# Patient Record
Sex: Female | Born: 1995 | Hispanic: Yes | Marital: Single | Smoking: Never smoker
Health system: Southern US, Community
[De-identification: ages and names within clinical notes are randomized; demographics above are authoritative.]

## PROBLEM LIST (undated history)

## (undated) ENCOUNTER — Inpatient Hospital Stay (HOSPITAL_COMMUNITY): Payer: Self-pay

## (undated) DIAGNOSIS — Z789 Other specified health status: Secondary | ICD-10-CM

## (undated) HISTORY — PX: APPENDECTOMY: SHX54

## (undated) HISTORY — DX: Other specified health status: Z78.9

---

## 2017-05-05 NOTE — L&D Delivery Note (Signed)
Patient: Amanda Bright MRN: [id number]  GBS status: Negative  Patient is a 22 y.o. now G1P1 s/p NSVD at [redacted]w[redacted]d, who was admitted for IOL for Pre-Eclampsia, on Magnesium during labor. AROM 1h 25m prior to delivery with bloody fluid.    Delivery Note At 6:47 AM a viable female was delivered via Vaginal, Spontaneous (Presentation: LOA).  APGAR: 8, 9; weight 7 lb 0.7 oz (3195 g).   Placenta status: spontaneous, intact.  Cord: 3 vessel with the following complications: none.    Anesthesia:  Epidural, 30 cc of Lidocaine for repair  Episiotomy: None Lacerations: 3rd degree Suture Repair: 3.0 vicryl, 3.0 monocryl  Est. Blood Loss (mL):  825    Head delivered LOA. No nuchal cord present. Shoulder and body delivered in usual fashion. Infant with spontaneous cry, placed on mother's abdomen, dried and bulb suctioned. Cord clamped x 2 after 1-minute delay, and cut by family member. Cord blood drawn. Placenta delivered spontaneously with gentle cord traction.  Perineum inspected and found to have 3A laceration, which was repaired with 3.0 Vicryl and 3.0 Monocryl with good hemostasis achieved. Delivery complicated by PPH with EBL of 825. Cytotec 1000 mg rectally, Methergine 0.2 mg IM, and TXA x2 given. Fundus firm with massage and Pitocin as well as additional medications mentioned.   Given severe Pre-eclampsia, will continue Magnesium Sulfate for 24h PP.   Mom to postpartum.  Baby to Couplet care / Skin to Skin.  De Hollingshead 01/12/2018, 8:39 AM

## 2017-07-22 ENCOUNTER — Inpatient Hospital Stay (HOSPITAL_COMMUNITY)
Admission: AD | Admit: 2017-07-22 | Discharge: 2017-07-22 | Disposition: A | Discharge status: Home / Self Care | Payer: Self-pay | Source: Ambulatory Visit | Attending: Obstetrics and Gynecology | Admitting: Obstetrics and Gynecology

## 2017-07-22 ENCOUNTER — Encounter (HOSPITAL_COMMUNITY): Payer: Self-pay

## 2017-07-22 ENCOUNTER — Other Ambulatory Visit: Payer: Self-pay

## 2017-07-22 DIAGNOSIS — Z9889 Other specified postprocedural states: Secondary | ICD-10-CM | POA: Insufficient documentation

## 2017-07-22 DIAGNOSIS — R109 Unspecified abdominal pain: Secondary | ICD-10-CM | POA: Insufficient documentation

## 2017-07-22 DIAGNOSIS — Z3A15 15 weeks gestation of pregnancy: Secondary | ICD-10-CM | POA: Insufficient documentation

## 2017-07-22 DIAGNOSIS — N949 Unspecified condition associated with female genital organs and menstrual cycle: Secondary | ICD-10-CM

## 2017-07-22 DIAGNOSIS — O26892 Other specified pregnancy related conditions, second trimester: Secondary | ICD-10-CM | POA: Insufficient documentation

## 2017-07-22 LAB — URINALYSIS, ROUTINE W REFLEX MICROSCOPIC
Bilirubin Urine: NEGATIVE
Glucose, UA: NEGATIVE mg/dL
Hgb urine dipstick: NEGATIVE
Ketones, ur: NEGATIVE mg/dL
Nitrite: NEGATIVE
PROTEIN: NEGATIVE mg/dL
SPECIFIC GRAVITY, URINE: 1.015 (ref 1.005–1.030)
pH: 7 (ref 5.0–8.0)

## 2017-07-22 NOTE — MAU Provider Note (Signed)
HPI: [name] is a 22yo G1P0 at ~15wks who presents with abdominal pain. She complains of sharp bilateral lower abdominal pain that is worse with movement and lying down for the past two weeks. She has had morning sickness throughout this pregnancy but no increased n/v. She had negative STI screening a few weeks ago and denies any new partners or concern for STI. Her dating as not been confirmed by US at this time, and she had irregular periods due to prior depo. She denies vaginal bleeding, LOF, urinary symptoms, fever, chills.   PMH:  Appendectomy ~2004  PE:  General: well appearing in no distress CV: RRR, no MRG Pulm: CTAB Abdominal: mild lower abdominal tenderness > in the suprapubic region. Bimanual: uterus consistent with [redacted]wk gestation. No CMT  Urinalysis    Component Value Date/Time   COLORURINE YELLOW 07/22/2017 0950   APPEARANCEUR CLEAR 07/22/2017 0950   LABSPEC 1.015 07/22/2017 0950   PHURINE 7.0 07/22/2017 0950   GLUCOSEU NEGATIVE 07/22/2017 0950   HGBUR NEGATIVE 07/22/2017 0950   BILIRUBINUR NEGATIVE 07/22/2017 0950   KETONESUR NEGATIVE 07/22/2017 0950   PROTEINUR NEGATIVE 07/22/2017 0950   NITRITE NEGATIVE 07/22/2017 0950   LEUKOCYTESUR MODERATE (A) 07/22/2017 0950    A/P [name] is a 22 yo G1P0 at ~15 wks who presents with sharp lower abdominal pain consistent with round ligament pain.   -Plan to defer STI testing at this time given recent negative results -[facility] -Reassured patient about normalcy of round ligament pain and outpatient treatment measures.   Lawanna KobusS. Amanda Bright Medical Student 07/22/17 10:59

## 2017-07-22 NOTE — MAU Provider Note (Signed)
History     CSN: [id number]  Arrival date and time: 07/22/17 [id number]   First Provider Initiated Contact with Patient 07/22/17 1011      Chief Complaint  Patient presents with  . Abdominal Pain   HPI [name] is a 22 y.o. G1P0 at 5153w5d who presents to MAU today with complaint of 2 weeks of sharp 10/10 pain in her lower abdomen bilaterally. The pain comes and goes and is worse with ambulation and changes of positions. She denies vaginal bleeding, abnormal discharge, UTI symptoms or diarrhea/constipation. She has had some nausea and occasional vomiting that is unchanged from earlier in the pregnancy. She has not taken any pain medication.   OB History    Gravida Para Term Preterm AB Living   1             SAB TAB Ectopic Multiple Live Births                  History reviewed. No pertinent past medical history.  Past Surgical History:  Procedure Laterality Date  . APPENDECTOMY     3rd grade    Family History  Problem Relation Age of Onset  . Alcohol abuse Neg Hx   . Arthritis Neg Hx   . Asthma Neg Hx   . Birth defects Neg Hx   . Cancer Neg Hx   . COPD Neg Hx   . Depression Neg Hx   . Diabetes Neg Hx   . Drug abuse Neg Hx   . Early death Neg Hx   . Hearing loss Neg Hx   . Heart disease Neg Hx   . Hyperlipidemia Neg Hx   . Hypertension Neg Hx   . Kidney disease Neg Hx   . Learning disabilities Neg Hx   . Mental illness Neg Hx   . Mental retardation Neg Hx   . Miscarriages / Stillbirths Neg Hx   . Stroke Neg Hx   . Vision loss Neg Hx   . Varicose Veins Neg Hx     Social History   Tobacco Use  . Smoking status: Never Smoker  . Smokeless tobacco: Never Used  Substance Use Topics  . Alcohol use: Yes    Comment: social  . Drug use: No    Allergies: No Known Allergies  No medications prior to admission.    Review of Systems  Constitutional: Negative for fever.  Gastrointestinal: Positive for abdominal pain, nausea and vomiting. Negative for  constipation and diarrhea.  Genitourinary: Negative for dysuria, frequency, urgency, vaginal bleeding and vaginal discharge.   Physical Exam   Blood pressure 132/74, pulse 87, temperature 98.2 F (36.8 C), temperature source Oral, resp. rate 18, height 5\' 4"  (1.626 m), weight 130 lb (59 kg), SpO2 100 %.  Physical Exam  Nursing note and vitals reviewed. Constitutional: She is oriented to person, place, and time. She appears well-developed and well-nourished. No distress.  HENT:  Head: Normocephalic and atraumatic.  Cardiovascular: Normal rate.  Respiratory: Effort normal.  GI: Soft. Bowel sounds are normal. She exhibits no distension and no mass. There is tenderness (very mild lower abdominal tenderness to palpation of the lower abdomen bilaterally). There is no rebound and no guarding.  Neurological: She is alert and oriented to person, place, and time.  Skin: Skin is warm and dry. No erythema.  Psychiatric: She has a normal mood and affect.  Dilation: Closed Effacement (%): Thick Cervical Position: Posterior Exam by:: Vonzella NippleJulie Chenelle Benning, PA-C   No results  found for this or any previous visit (from the past 24 hour(s)).  MAU Course  Procedures None  MDM FHR - 148 bpm with doppler UA today  STD testing deferred as patient states negative testing < 3 weeks ago and no new partners or symptoms.  Assessment and Plan  A:  SIUP at [redacted]w[redacted]d (approximately)  Round ligament pain   P:  Discharge home Tylenol PRN for pain Abdominal binder, moderation of activity and warm bath/shower advised for pain management  Second trimester precautions discussed Patient advised to follow-up with [facility] to start prenatal care Contact information given for [employer] to help with financial coverage since patient does not qualify for Medicaid as a non-citizen Patient may return to MAU as needed or if her condition were to change or worsen   Vonzella Nipple, PA-C 07/22/2017, 10:55 AM

## 2017-07-22 NOTE — Discharge Instructions (Signed)

## 2017-07-22 NOTE — MAU Note (Addendum)
G1 @ [redacted] wksga according to LMP at [facility]ath department. [facility] car trouble. Here due to abdominal pain that started two weeks ago. Denies bleeding.   Provider at bs assessing.   1053: d/c orders received.  D/C instructions given with pt understanding. Pt left unit via ambulatory with her sister.

## 2017-07-29 ENCOUNTER — Encounter: Payer: Self-pay | Admitting: Medical

## 2017-09-07 ENCOUNTER — Ambulatory Visit (INDEPENDENT_AMBULATORY_CARE_PROVIDER_SITE_OTHER): Payer: Self-pay | Admitting: Advanced Practice Midwife

## 2017-09-07 ENCOUNTER — Encounter: Payer: Self-pay | Admitting: Advanced Practice Midwife

## 2017-09-07 DIAGNOSIS — Z3402 Encounter for supervision of normal first pregnancy, second trimester: Secondary | ICD-10-CM

## 2017-09-07 DIAGNOSIS — Z3400 Encounter for supervision of normal first pregnancy, unspecified trimester: Secondary | ICD-10-CM

## 2017-09-07 LAB — POCT URINALYSIS DIP (DEVICE)
Bilirubin Urine: NEGATIVE
GLUCOSE, UA: NEGATIVE mg/dL
Hgb urine dipstick: NEGATIVE
Ketones, ur: NEGATIVE mg/dL
NITRITE: NEGATIVE
Protein, ur: NEGATIVE mg/dL
Specific Gravity, Urine: 1.025 (ref 1.005–1.030)
UROBILINOGEN UA: 1 mg/dL (ref 0.0–1.0)
pH: 7 (ref 5.0–8.0)

## 2017-09-07 NOTE — Progress Notes (Addendum)
Subjective:   Amanda Bright is a 22 y.o. G1P0000 at [redacted]w[redacted]d by LMP being seen today for her first obstetrical visit.  Her obstetrical history is significant for none. Patient does intend to breast feed. Pregnancy history fully reviewed.  Patient was on depo provera. She had a +UPT at [facility] clinic in [place] on 05/20/17, and states that an [facility] was calculated at that time based on physical exam. No [place] was done.   Patient reports no complaints.  HISTORY: OB History  Gravida Para Term Preterm AB Living  1 0 0 0 0 0  SAB TAB Ectopic Multiple Live Births  0 0 0 0 0    # Outcome Date GA Lbr Len/2nd Weight Sex Delivery Anes PTL Lv  1 Current             Last pap smear was done 07/2017 and was normal per patient, will get records   Past Medical History:  Diagnosis Date  . Medical history non-contributory    Past Surgical History:  Procedure Laterality Date  . APPENDECTOMY     3rd grade   Family History  Problem Relation Age of Onset  . Alcohol abuse Neg Hx   . Arthritis Neg Hx   . Asthma Neg Hx   . Birth defects Neg Hx   . Cancer Neg Hx   . COPD Neg Hx   . Depression Neg Hx   . Diabetes Neg Hx   . Drug abuse Neg Hx   . Early death Neg Hx   . Hearing loss Neg Hx   . Heart disease Neg Hx   . Hyperlipidemia Neg Hx   . Hypertension Neg Hx   . Kidney disease Neg Hx   . Learning disabilities Neg Hx   . Mental illness Neg Hx   . Mental retardation Neg Hx   . Miscarriages / Stillbirths Neg Hx   . Stroke Neg Hx   . Vision loss Neg Hx   . Varicose Veins Neg Hx    Social History   Tobacco Use  . Smoking status: Never Smoker  . Smokeless tobacco: Never Used  Substance Use Topics  . Alcohol use: Yes    Comment: social  . Drug use: No   No Known Allergies Current Outpatient Medications on File Prior to Visit  Medication Sig Dispense Refill  . prenatal vitamin w/FE, FA (PRENATAL 1 + 1) 27-1 MG TABS tablet Take 1 tablet by mouth daily at 12 noon.     No current  facility-administered medications on file prior to visit.     Review of Systems Pertinent items noted in HPI and remainder of comprehensive ROS otherwise negative.  Exam   Vitals:   09/07/17 0847  BP: 106/68  Pulse: 71  Weight: 132 lb 12.8 oz (60.2 kg)   Fetal Heart Rate (bpm): 145  Uterus:   21cm  Pelvic Exam: Perineum:    Vulva:    Vagina:     Cervix:    Adnexa:    Bony Pelvis:   System: General: well-developed, well-nourished female in no acute distress   Breast:  normal appearance, no masses or tenderness   Skin: normal coloration and turgor, no rashes   Neurologic: oriented, normal, negative, normal mood   Extremities: normal strength, tone, and muscle mass, ROM of all joints is normal   HEENT PERRLA, extraocular movement intact and sclera clear, anicteric   Mouth/Teeth mucous membranes moist, pharynx normal without lesions and dental hygiene good   Neck supple  and no masses   Cardiovascular: regular rate and rhythm   Respiratory:  no respiratory distress, normal breath sounds   Abdomen: soft, non-tender; bowel sounds normal; no masses,  no organomegaly    Assessment:   Pregnancy: G1P0000 Patient Active Problem List   Diagnosis Date Noted  . Supervision of normal first pregnancy, antepartum 09/07/2017     Plan:  1. Supervision of normal first pregnancy, antepartum  - [facility] AMB BABYSCRIPTS OPT IN - Culture, OB Urine - Cystic fibrosis gene test - GC/Chlamydia probe amp ([place])not at [facility] - Hemoglobinopathy Evaluation - Obstetric Panel, Including HIV - SMN1 COPY NUMBER ANALYSIS (SMA Carrier Screen) - [place] MFM OB COMP + 14 WK; Future - Need to get ROI for pap records at [place]    Initial labs drawn. Continue prenatal vitamins. Genetic Screening discussed, First trimester screen, Quad screen and NIPS: too late for some, and declines others . Ultrasound discussed; fetal anatomic survey: requested. Problem list reviewed and updated. The nature of [place]  [facility] with multiple MDs and other Advanced Practice Providers was explained to patient; also emphasized that residents, students are part of our team. Routine obstetric precautions reviewed. Return in about 1 month (around 10/05/2017).

## 2017-09-07 NOTE — Patient Instructions (Signed)
AREA PEDIATRIC/FAMILY PRACTICE PHYSICIANS  [facility] [address], [address] [place], [place]  [address] Phone - [phone]   Fax - [phone]  [facility] [address] [place], [place] [address] Phone - [phone]   Fax - [phone]  [facility] AM[facility] [address] [place], [place]  [address] Phone - [phone]   Fax - [phone]  [facility] [address] [place], [place]  [address] Phone - [phone]   Fax - [phone]  [facility] [address] [place], [place]  [address] Phone - [phone]   Fax - [phone]  [facility] [address] [place], [place]  [address] Phone - [phone]   Fax - [phone]  [facility] [address] [place], [place]  [address] Phone - [phone]   Fax - [phone]  [facility] [address] [place], [place]  [address] Phone - [phone]   Fax - [phone]  [facility] [address] [place], [place]  [address] Phone - [phone]   Fax - [phone] [facility] [address] [place], [place]  [address] Phone - [phone]   Fax - [phone]  [facility] [address] [place], [place]  [address] Phone - [phone]   Fax - [phone]  [facility] [address] [place], [place]  [address] Phone - [phone]   Fax - [phone]  [facility] [address] [place], [place]  [address] Phone - [phone]   Fax - [phone]  SHILPA GOSRANI [address] [place], [place]  [address] Phone - [phone]  [facility] [address] [place], [place]  [address] Phone - [phone]   Fax - [phone]  [facility] [address] [place], [place]  [address] Phone - [phone]   Fax - [phone]  [facility] [address] [place], [place]  [address] Phone - [phone]   Fax - [phone]  [facility] [address] [place], [place]  [address] Phone - [phone]   Fax - [phone]   [facility] [address] [place], [place]  [address] Phone - [phone]   Fax - [phone]  [facility] [address] [place], [place]  [address] Phone - [phone]   Fax - [phone]  DAVID RUBIN [address] [place], [place]  [address] Phone - [phone]   Fax - [phone]  [facility] [address] , [place]  [address] Phone - [phone]   Fax - [phone]  [facility] [address] , [place]  [address] Phone - [phone]   Fax - [phone] [facility] [address] [place], [place]  [address] Phone - [phone]   Fax - [phone]  [facility] [address] [place], [place]  [address] Phone - [phone]   Fax - [phone]  [facility] [address] [place], [place]  [address] Phone - [phone]   Fax - [phone]  [facility] Charlene Flemming MD [address] [place] [address] Phone [phone]  Fax [phone]  Childbirth Education Options: [facility] Classes:  Childbirth education classes can help you   get ready for a positive parenting experience. You can also meet other expectant parents and get free stuff for your baby. Each class runs for five weeks on the same night and costs $45 for the mother-to-be and her support person. Medicaid covers the cost if you are eligible. Call [phone] to register. [facility] Childbirth Education:  [phone] or 5174845873 or  [email or web address]  Baby & Me Class: Discuss newborn & infant parenting and family adjustment issues with other new mothers in a relaxed environment. Each week brings a new speaker or baby-centered activity. We encourage new mothers to join [place] every Thursday at 11:00am. Babies birth until crawling. No registration or fee. Daddy [employer]: This course offers Dads-to-be the tools and knowledge needed to feel confident on their journey to becoming new fathers. Experienced dads, who have been trained as coaches, teach dads-to-be how to hold, comfort, diaper, swaddle and play with their infant while being able to support the new mom as well. A class for men taught by men. $25/dad Big Brother/Big Sister: Let your children share in the joy of a new brother or sister in this special class designed just for them. Class includes discussion about how families care for babies: swaddling, holding, diapering, safety as well as how they can be helpful in their new role. This class is designed for children ages 26 to 31, but any age is welcome. Please register each child individually. $5/child  Mom Talk: This mom-led group offers support and connection to mothers as they journey through the adjustments and struggles of that sometimes overwhelming first year after the birth of a child. Tuesdays at 10:00am and Thursdays at 6:00pm. Babies welcome. No registration or fee. Breastfeeding Support Group: This group is a mother-to-mother support circle where moms have the opportunity to share their breastfeeding experiences. A Lactation Consultant is present for questions and concerns. Meets each Tuesday at 11:00am. No fee or registration. Breastfeeding Your Baby: Learn what to expect in the first days of breastfeeding your newborn.  This class will help you feel more confident with the skills needed to begin your breastfeeding experience. Many new mothers are concerned about breastfeeding after leaving the hospital. This class  will also address the most common fears and challenges about breastfeeding during the first few weeks, months and beyond. (call for fee) Comfort Techniques and Tour: This 2 hour interactive class will provide you the opportunity to learn & practice hands-on techniques that can help relieve some of the discomfort of labor and encourage your baby to rotate toward the best position for birth. You and your partner will be able to try a variety of labor positions with birth balls and rebozos as well as practice breathing, relaxation, and visualization techniques. A tour of the [facility] is included with this class. $20 per registrant and support person Childbirth Class- Weekend Option: This class is a Weekend version of our Birth & Baby series. It is designed for parents who have a difficult time fitting several weeks of classes into their schedule. It covers the care of your newborn and the basics of labor and childbirth. It also includes a [place] [facility] and lunch. The class is held two consecutive days: beginning on Friday evening from 6:30 - 8:30 p.m. and the next day, Saturday from 9 a.m. - 4 p.m. (call for fee) Doren Custard Class: Interested in a waterbirth?  This informational class will help you discover whether waterbirth is the right fit for you.  Education about waterbirth itself, supplies you would need and how to assemble your support team is what you can expect from this class. Some obstetrical practices require this class in order to pursue a waterbirth. (Not all obstetrical practices offer waterbirth-check with your healthcare provider.) Register only the expectant mom, but you are encouraged to bring your partner to class! Required if planning waterbirth, no fee. Infant/Child CPR: Parents, grandparents, babysitters, and friends learn Cardio-Pulmonary Resuscitation skills for infants and children. You will also learn how to treat both conscious  and unconscious choking in infants and children. This Family & Friends program does not offer certification. Register each participant individually to ensure that enough mannequins are available. (Call for fee) Grandparent Love: Expecting a grandbaby? This class is for you! Learn about the latest infant care and safety recommendations and ways to support your own child as he or she transitions into the parenting role. Taught by Registered Nurses who are childbirth instructors, but most importantly...they are grandmothers too! $10/person. Childbirth Class- Natural Childbirth: This series of 5 weekly classes is for expectant parents who want to learn and practice natural methods of coping with the process of labor and childbirth. Relaxation, breathing, massage, visualization, role of the partner, and helpful positioning are highlighted. Participants learn how to be confident in their body's ability to give birth. This class will empower and help parents make informed decisions about their own care. Includes discussion that will help new parents transition into the immediate postpartum period. [facility] is included. We suggest taking this class between 25-32 weeks, but it's only a recommendation. $75 per registrant and one support person or $30 Medicaid. Childbirth Class- 3 week Series: This option of 3 weekly classes helps you and your labor partner prepare for childbirth. Newborn care, labor & birth, cesarean birth, pain management, and comfort techniques are discussed and a [place] [facility] is included. The class meets at the same time, on the same day of the week for 3 consecutive weeks beginning with the starting date you choose. $60 for registrant and one support person.  Marvelous Multiples: Expecting twins, triplets, or more? This class covers the differences in labor, birth, parenting, and breastfeeding issues that face multiples' parents.  NICU tour is included. Led by a Certified Childbirth Educator who is the mother of twins. No fee. Caring for Baby: This class is for expectant and adoptive parents who want to learn and practice the most up-to-date newborn care for their babies. Focus is on birth through the first six weeks of life. Topics include feeding, bathing, diapering, crying, umbilical cord care, circumcision care and safe sleep. Parents learn to recognize symptoms of illness and when to call the pediatrician. Register only the mom-to-be and your partner or support person can plan to come with you! $10 per registrant and support person Childbirth Class- online option: This online class offers you the freedom to complete a Birth and Baby series in the comfort of your own home. The flexibility of this option allows you to review sections at your own pace, at times convenient to you and your support people. It includes additional video information, animations, quizzes, and extended activities. Get organized with helpful eClass tools, checklists, and trackers. Once you register online for the class, you will receive an email within a few days to accept the invitation and begin the class when the time is right for you. The content will be available to you for 60 days. $  60 for 60 days of online access for you and your support people.  Local Doulas: Natural Baby Doulas [email or web address] Tel: [phone] [email or web address] [facility] [phone] [email or web address] The Labor Ladies  (also do waterbirth tub rental) [phone] [email or web address] [facility] [phone] [email or web address] Sacred Rhythms  [phone] [email or web address] [place] [facility] [employer] (PADA) [email or web address] http://www.padanc.org/index.htm [place] [facility]   [email or web address] Considering Waterbirth? Guide for patients at [facility]  Why consider waterbirth?  . Gentle birth for babies . Less pain medicine used in labor . May allow for passive descent/less pushing . May reduce perineal tears  . More mobility and instinctive maternal position changes . Increased maternal relaxation . Reduced blood pressure in labor  Is waterbirth safe? What are the risks of infection, drowning or other complications?  . Infection: o Very low risk (3.7 % for tub vs 4.8% for bed) o 7 in 8000 waterbirths with documented infection o Poorly cleaned equipment most common cause o Slightly lower group B strep transmission rate  . Drowning o Maternal:  - Very low risk   - Related to seizures or fainting o Newborn:  - Very low risk. No evidence of increased risk of respiratory problems in multiple large studies - Physiological protection from breathing under water - Avoid underwater birth if there are any fetal complications - Once baby's head is out of the water, keep it out.  . Birth complication o Some reports of cord trauma, but risk decreased by bringing baby to surface gradually o No evidence of increased risk of shoulder dystocia. Mothers can usually change positions faster in water than in a bed, possibly aiding the maneuvers to free the shoulder.   You must attend a Waterbirth class at [facility]  3rd Wednesday of every month from 7-9pm  Free  Register by calling [phone] or online at [email or web address]  Bring us the certificate from the class to your prenatal appointment  Meet with a midwife at 36 weeks to see if you can still plan a waterbirth and to sign the consent.   Purchase or rent the following supplies:   Water Birth Pool (Birth Pool in a Box or [place] for instance)  (Tubs start ~$125)  Single-use disposable tub liner designed for your brand of tub  New garden hose labeled  "lead-free", "suitable for drinking water",  Electric drain pump to remove water (We recommend 792 gallon per hour or greater pump.)   Separate garden hose to remove the dirty water  Fish net  Bathing suit top (optional)  Long-handled mirror (optional)  Places to purchase or rent supplies  Yourwaterbirth.com for tub purchases and supplies  Waterbirthsolutions.com for tub purchases and supplies  The Labor Ladies (www.[email or web address]) $275 for tub rental/set-up & take down/kit   [place] [facility] (http://www.padanc.org/MeetUs.htm) Information regarding doulas (labor support) who provide pool rentals  Our practice has a Birth Pool in a Box tub at the hospital that you may borrow on a first-come-first-served basis. It is your responsibility to to set up, clean and break down the tub. We cannot guarantee the availability of this tub in advance. You are responsible for bringing all accessories listed above. If you do not have all necessary supplies you cannot have a waterbirth.    Things that would prevent you from having a waterbirth:  Premature, <37wks  Previous cesarean birth  Presence of thick meconium-stained fluid  Multiple gestation (Twins, triplets, etc.)  Uncontrolled diabetes or gestational diabetes requiring medication  Hypertension requiring medication   or diagnosis of pre-eclampsia  Heavy vaginal bleeding  Non-reassuring fetal heart rate  Active infection (MRSA, etc.). Group B Strep is NOT a contraindication for  waterbirth.  If your labor has to be induced and induction method requires continuous  monitoring of the baby's heart rate  Other risks/issues identified by your obstetrical provider  Please remember that birth is unpredictable. Under certain unforeseeable circumstances your provider may advise against giving birth in the tub. These decisions will be made on a case-by-case basis and with the safety of you and your baby as our highest  priority.     Places to have your son circumcised:    [facility] [phone] $480 while you are in hospital  [facility] [phone] $244 by 4 wks  [facility] [phone] $[phone] by 2 wks  [facility] [phone] $250 by 7 days [facility] [phone] $269 by 4 wks  These prices sometimes change but are roughly what you can expect to pay. Please call and confirm pricing.   Circumcision is considered an elective/non-medically necessary procedure. There are many reasons parents decide to have their sons circumsized. During the first year of life circumcised males have a reduced risk of urinary tract infections but after this year the rates between circumcised males and uncircumcised males are the same.  It is safe to have your son circumcised outside of the hospital and the places above perform them regularly.   Deciding about Circumcision in Baby Boys  (Up-to-date The Basics)  What is circumcision?  Circumcision is a surgery that removes the skin that covers the tip of the penis, called the "foreskin" Circumcision is usually done when a boy is between 1 and 10 days old. In the [place], circumcision is common. In some other countries, fewer boys are circumcised. Circumcision is a common tradition in some religions.  Should I have my baby boy circumcised?  There is no easy answer. Circumcision has some benefits. But it also has risks. After talking with your doctor, you will have to decide for yourself what is right for your family.  What are the benefits of circumcision?  Circumcised boys seem to have slightly lower rates of: ?Urinary tract infections ?Swelling of the opening at the tip of the penis Circumcised men seem to have slightly lower rates of: ?Urinary tract infections ?Swelling of the opening at the tip of the  penis ?Penis cancer ?HIV and other infections that you catch during sex ?Cervical cancer in the women they have sex with Even so, in the [place], the risks of these problems are small - even in boys and men who have not been circumcised. Plus, boys and men who are not circumcised can reduce these extra risks by: ?Cleaning their penis well ?Using condoms during sex  What are the risks of circumcision?  Risks include: ?Bleeding or infection from the surgery ?Damage to or amputation of the penis ?A chance that the doctor will cut off too much or not enough of the foreskin ?A chance that sex won't feel as good later in life Only about 1 out of every 200 circumcisions leads to problems. There is also a chance that your health insurance won't pay for circumcision.  How is circumcision done in baby boys?  First, the baby gets medicine for pain relief. This might be a cream on the skin or a shot into the base of the penis. Next, the doctor cleans the baby's penis well. Then he or she uses special tools to cut off the foreskin.   Finally, the doctor wraps a bandage (called gauze) around the baby's penis. If you have your baby circumcised, his doctor or nurse will give you instructions on how to care for him after the surgery. It is important that you follow those instructions carefully.  Safe Medications in Pregnancy   Acne: Benzoyl Peroxide Salicylic Acid  Backache/Headache: Tylenol: 2 regular strength every 4 hours OR              2 Extra strength every 6 hours  Colds/Coughs/Allergies: Benadryl (alcohol free) 25 mg every 6 hours as needed Breath right strips Claritin Cepacol throat lozenges Chloraseptic throat spray Cold-Eeze- up to three times per day Cough drops, alcohol free Flonase (by prescription only) Guaifenesin Mucinex Robitussin DM (plain only, alcohol free) Saline nasal spray/drops Sudafed (pseudoephedrine) & Actifed ** use only after [redacted] weeks gestation and if you  do not have high blood pressure Tylenol Vicks Vaporub Zinc lozenges Zyrtec   Constipation: Colace Ducolax suppositories Fleet enema Glycerin suppositories Metamucil Milk of magnesia Miralax Senokot Smooth move tea  Diarrhea: Kaopectate Imodium A-D  *NO pepto Bismol  Hemorrhoids: Anusol Anusol HC Preparation H Tucks  Indigestion: Tums Maalox Mylanta Zantac  Pepcid  Insomnia: Benadryl (alcohol free) 25mg every 6 hours as needed Tylenol PM Unisom, no Gelcaps  Leg Cramps: Tums MagGel  Nausea/Vomiting:  Bonine Dramamine Emetrol Ginger extract Sea bands Meclizine  Nausea medication to take during pregnancy:  Unisom (doxylamine succinate 25 mg tablets) Take one tablet daily at bedtime. If symptoms are not adequately controlled, the dose can be increased to a maximum recommended dose of two tablets daily (1/2 tablet in the morning, 1/2 tablet mid-afternoon and one at bedtime). Vitamin B6 100mg tablets. Take one tablet twice a day (up to 200 mg per day).  Skin Rashes: Aveeno products Benadryl cream or 25mg every 6 hours as needed Calamine Lotion 1% cortisone cream  Yeast infection: Gyne-lotrimin 7 Monistat 7   **If taking multiple medications, please check labels to avoid duplicating the same active ingredients **take medication as directed on the label ** Do not exceed 4000 mg of tylenol in 24 hours **Do not take medications that contain aspirin or ibuprofen     

## 2017-09-08 ENCOUNTER — Other Ambulatory Visit: Payer: Self-pay | Admitting: Advanced Practice Midwife

## 2017-09-08 ENCOUNTER — Ambulatory Visit (HOSPITAL_COMMUNITY)
Admission: RE | Admit: 2017-09-08 | Discharge: 2017-09-08 | Disposition: A | Discharge status: Home / Self Care | Payer: Self-pay | Source: Ambulatory Visit | Attending: Advanced Practice Midwife | Admitting: Advanced Practice Midwife

## 2017-09-08 DIAGNOSIS — Z3400 Encounter for supervision of normal first pregnancy, unspecified trimester: Secondary | ICD-10-CM

## 2017-09-08 DIAGNOSIS — Z369 Encounter for antenatal screening, unspecified: Secondary | ICD-10-CM

## 2017-09-08 DIAGNOSIS — O321XX0 Maternal care for breech presentation, not applicable or unspecified: Secondary | ICD-10-CM | POA: Insufficient documentation

## 2017-09-08 DIAGNOSIS — O0932 Supervision of pregnancy with insufficient antenatal care, second trimester: Secondary | ICD-10-CM

## 2017-09-08 DIAGNOSIS — Z3A21 21 weeks gestation of pregnancy: Secondary | ICD-10-CM

## 2017-09-08 DIAGNOSIS — Z3689 Encounter for other specified antenatal screening: Secondary | ICD-10-CM | POA: Insufficient documentation

## 2017-09-10 LAB — CULTURE, OB URINE

## 2017-09-10 LAB — URINE CULTURE, OB REFLEX

## 2017-09-14 ENCOUNTER — Encounter: Payer: Self-pay | Admitting: Advanced Practice Midwife

## 2017-09-14 LAB — HEMOGLOBIN A1C
Est. average glucose Bld gHb Est-mCnc: 103 mg/dL
Hgb A1c MFr Bld: 5.2 % (ref 4.8–5.6)

## 2017-09-14 LAB — SMN1 COPY NUMBER ANALYSIS (SMA CARRIER SCREENING)

## 2017-09-14 LAB — OBSTETRIC PANEL, INCLUDING HIV
Antibody Screen: NEGATIVE
BASOS ABS: 0 10*3/uL (ref 0.0–0.2)
Basos: 0 %
EOS (ABSOLUTE): 0.1 10*3/uL (ref 0.0–0.4)
Eos: 1 %
HEP B S AG: NEGATIVE
HIV Screen 4th Generation wRfx: NONREACTIVE
Hematocrit: 31.5 % — ABNORMAL LOW (ref 34.0–46.6)
Hemoglobin: 10.6 g/dL — ABNORMAL LOW (ref 11.1–15.9)
IMMATURE GRANULOCYTES: 1 %
Immature Grans (Abs): 0 10*3/uL (ref 0.0–0.1)
LYMPHS ABS: 2.3 10*3/uL (ref 0.7–3.1)
LYMPHS: 31 %
MCH: 29.4 pg (ref 26.6–33.0)
MCHC: 33.7 g/dL (ref 31.5–35.7)
MCV: 87 fL (ref 79–97)
Monocytes Absolute: 0.6 10*3/uL (ref 0.1–0.9)
Monocytes: 8 %
NEUTROS PCT: 59 %
Neutrophils Absolute: 4.4 10*3/uL (ref 1.4–7.0)
PLATELETS: 312 10*3/uL (ref 150–379)
RBC: 3.61 x10E6/uL — AB (ref 3.77–5.28)
RDW: 14 % (ref 12.3–15.4)
RPR: NONREACTIVE
Rh Factor: POSITIVE
Rubella Antibodies, IGG: 1.72 index (ref >0.99)
WBC: 7.4 10*3/uL (ref 3.4–10.8)

## 2017-09-14 LAB — HEMOGLOBINOPATHY EVALUATION
FERRITIN: 10 ng/mL — AB (ref 15–150)
HGB A2 QUANT: 2.3 % (ref 1.8–3.2)
HGB A: 97.7 % (ref 96.4–98.8)
HGB VARIANT: 0 %
Hgb C: 0 %
Hgb F Quant: 0 % (ref 0.0–2.0)
Hgb S: 0 %
Hgb Solubility: NEGATIVE

## 2017-09-14 LAB — SPECIMEN STATUS REPORT

## 2017-09-14 LAB — CYSTIC FIBROSIS GENE TEST

## 2017-10-02 ENCOUNTER — Other Ambulatory Visit: Payer: Self-pay | Admitting: General Practice

## 2017-10-02 DIAGNOSIS — Z3400 Encounter for supervision of normal first pregnancy, unspecified trimester: Secondary | ICD-10-CM

## 2017-10-05 ENCOUNTER — Telehealth: Payer: Self-pay | Admitting: Advanced Practice Midwife

## 2017-10-05 ENCOUNTER — Encounter: Payer: Self-pay | Admitting: Advanced Practice Midwife

## 2017-10-05 ENCOUNTER — Ambulatory Visit (INDEPENDENT_AMBULATORY_CARE_PROVIDER_SITE_OTHER): Payer: Self-pay | Admitting: Advanced Practice Midwife

## 2017-10-05 ENCOUNTER — Other Ambulatory Visit (INDEPENDENT_AMBULATORY_CARE_PROVIDER_SITE_OTHER): Payer: Self-pay

## 2017-10-05 VITALS — BP 123/73 | HR 70 | Wt 138.1 lb

## 2017-10-05 DIAGNOSIS — N898 Other specified noninflammatory disorders of vagina: Secondary | ICD-10-CM

## 2017-10-05 DIAGNOSIS — Z3400 Encounter for supervision of normal first pregnancy, unspecified trimester: Secondary | ICD-10-CM

## 2017-10-05 DIAGNOSIS — Z113 Encounter for screening for infections with a predominantly sexual mode of transmission: Secondary | ICD-10-CM

## 2017-10-05 DIAGNOSIS — O26892 Other specified pregnancy related conditions, second trimester: Secondary | ICD-10-CM

## 2017-10-05 NOTE — Telephone Encounter (Signed)
[facility]

## 2017-10-05 NOTE — Progress Notes (Signed)
   PRENATAL VISIT NOTE  Subjective:  Amanda Bright is a 22 y.o. G1P0000 at 347w3d being seen today for ongoing prenatal care.  She is currently monitored for the following issues for this low-risk pregnancy and has Supervision of normal first pregnancy, antepartum on their problem list.  Patient reports no complaints.  Contractions: Not present. Vag. Bleeding: None.  Movement: Present. Denies leaking of fluid.   Patient c/o vaginal discharge with odor x 1 week.   The following portions of the patient's history were reviewed and updated as appropriate: allergies, current medications, past family history, past medical history, past social history, past surgical history and problem list. Problem list updated.  Objective:   Vitals:   10/05/17 0911  BP: 123/73  Pulse: 70  Weight: 138 lb 1.6 oz (62.6 kg)    Fetal Status: Fetal Heart Rate (bpm): 138 Fundal Height: 25 cm Movement: Present     General:  Alert, oriented and cooperative. Patient is in no acute distress.  Skin: Skin is warm and dry. No rash noted.   Cardiovascular: Normal heart rate noted  Respiratory: Normal respiratory effort, no problems with respiration noted  Abdomen: Soft, gravid, appropriate for gestational age.  Pain/Pressure: Present     Pelvic: Cervical exam deferred        Extremities: Normal range of motion.  Edema: None  Mental Status: Normal mood and affect. Normal behavior. Normal judgment and thought content.   Assessment and Plan:  Pregnancy: G1P0000 at [redacted]w[redacted]d  1. Supervision of normal first pregnancy, antepartum - US MFM OB FOLLOW UP; Future (scheduled for 10/12/17) - 2 hour GTT and labs today  - ROI for pap completed today   Preterm labor symptoms and general obstetric precautions including but not limited to vaginal bleeding, contractions, leaking of fluid and fetal movement were reviewed in detail with the patient. Please refer to After Visit Summary for other counseling recommendations.  Return in  about 1 month (around 11/02/2017).  Future Appointments  Date Time Provider Department Center  10/12/2017 12:45 PM [facility] US 2 [facility]    Thressa ShellerHeather Hogan, CNM

## 2017-10-05 NOTE — Progress Notes (Signed)
C/o vaginal itching and fishy smell. ROI for pap completed.

## 2017-10-06 LAB — CBC
HEMATOCRIT: 30.5 % — AB (ref 34.0–46.6)
Hemoglobin: 10 g/dL — ABNORMAL LOW (ref 11.1–15.9)
MCH: 28.3 pg (ref 26.6–33.0)
MCHC: 32.8 g/dL (ref 31.5–35.7)
MCV: 86 fL (ref 79–97)
PLATELETS: 311 10*3/uL (ref 150–450)
RBC: 3.53 x10E6/uL — AB (ref 3.77–5.28)
RDW: 13.5 % (ref 12.3–15.4)
WBC: 8.1 10*3/uL (ref 3.4–10.8)

## 2017-10-06 LAB — CERVICOVAGINAL ANCILLARY ONLY
Bacterial vaginitis: POSITIVE — AB
Candida vaginitis: NEGATIVE
Chlamydia: NEGATIVE
Neisseria Gonorrhea: NEGATIVE
Trichomonas: NEGATIVE

## 2017-10-06 LAB — GLUCOSE TOLERANCE, 2 HOURS W/ 1HR
Glucose, 1 hour: 127 mg/dL (ref 65–179)
Glucose, 2 hour: 115 mg/dL (ref 65–152)
Glucose, Fasting: 85 mg/dL (ref 65–91)

## 2017-10-06 LAB — RPR: RPR Ser Ql: NONREACTIVE

## 2017-10-06 LAB — HIV ANTIBODY (ROUTINE TESTING W REFLEX): HIV Screen 4th Generation wRfx: NONREACTIVE

## 2017-10-06 MED ORDER — METRONIDAZOLE 500 MG PO TABS: 500.0000 mg | ORAL_TABLET | Freq: Two times a day (BID) | ORAL | 0 refills | Status: DC

## 2017-10-06 NOTE — Addendum Note (Signed)
Addended by: Thressa ShellerHOGAN, HEATHER D on: 10/06/2017 04:08 PM   Modules accepted: Orders

## 2017-10-12 ENCOUNTER — Other Ambulatory Visit: Payer: Self-pay | Admitting: Advanced Practice Midwife

## 2017-10-12 ENCOUNTER — Ambulatory Visit (HOSPITAL_COMMUNITY)
Admission: RE | Admit: 2017-10-12 | Discharge: 2017-10-12 | Disposition: A | Discharge status: Home / Self Care | Payer: Self-pay | Source: Ambulatory Visit | Attending: Advanced Practice Midwife | Admitting: Advanced Practice Midwife

## 2017-10-12 ENCOUNTER — Encounter: Payer: Self-pay | Admitting: Advanced Practice Midwife

## 2017-10-12 DIAGNOSIS — Z362 Encounter for other antenatal screening follow-up: Secondary | ICD-10-CM | POA: Insufficient documentation

## 2017-10-12 DIAGNOSIS — Z3400 Encounter for supervision of normal first pregnancy, unspecified trimester: Secondary | ICD-10-CM

## 2017-10-12 DIAGNOSIS — Z0489 Encounter for examination and observation for other specified reasons: Secondary | ICD-10-CM

## 2017-10-12 DIAGNOSIS — Z3A26 26 weeks gestation of pregnancy: Secondary | ICD-10-CM | POA: Insufficient documentation

## 2017-10-12 DIAGNOSIS — IMO0002 Reserved for concepts with insufficient information to code with codable children: Secondary | ICD-10-CM

## 2017-11-02 ENCOUNTER — Ambulatory Visit (INDEPENDENT_AMBULATORY_CARE_PROVIDER_SITE_OTHER): Payer: Self-pay | Admitting: Advanced Practice Midwife

## 2017-11-02 ENCOUNTER — Encounter: Payer: Self-pay | Admitting: Advanced Practice Midwife

## 2017-11-02 DIAGNOSIS — Z3400 Encounter for supervision of normal first pregnancy, unspecified trimester: Secondary | ICD-10-CM

## 2017-11-02 MED ORDER — FERROUS SULFATE 325 (65 FE) MG PO TABS: 325.0000 mg | ORAL_TABLET | Freq: Two times a day (BID) | ORAL | 3 refills | Status: DC

## 2017-11-02 NOTE — Patient Instructions (Addendum)
BENEFITS OF BREASTFEEDING Many women wonder if they should breastfeed. Research shows that breast milk contains the perfect balance of vitamins, protein and fat that your baby needs to grow. It also contains antibodies that help your baby's immune system to fight off viruses and bacteria and can reduce the risk of sudden infant death syndrome (SIDS). In addition, the colostrum (a fluid secreted from the breast in the first few days after delivery) helps your newborn's digestive system to grow and function well. Breast milk is easier to digest than formula. Also, if your baby is born preterm, breast milk can help to reduce both short- and long-term health problems. BENEFITS OF BREASTFEEDING FOR MOM . Breastfeeding causes a hormone to be released that helps the uterus to contract and return to its normal size more quickly. . It aids in postpartum weight loss, reduces risk of breast and ovarian cancer, heart disease and rheumatoid arthritis. . It decreases the amount of bleeding after the baby is born. benefits of breastfeeding for baby . Provides comfort and nutrition . Protects baby against - Obesity - Diabetes - Asthma - Childhood cancers - Heart disease - Ear infections - Diarrhea - Pneumonia - Stomach problems - Serious allergies - Skin rashes . Promotes growth and development . Reduces the risk of baby having Sudden Infant Death Syndrome (SIDS) only breastmilk for the first 6 months . Protects baby against diseases/allergies . It's the perfect amount for tiny bellies . It restores baby's energy . Provides the best nutrition for baby . Giving water or formula can make baby more likely to get sick, decrease Mom's milk supply, make baby less content with breastfeeding Skin to Skin After delivery, the staff will place your baby on your chest. This helps with the following: . Regulates baby's temperature, breathing, heart rate and blood sugar . Increases Mom's milk supply . Promotes  bonding . Keeps baby and Mom calm and decreases baby's crying Rooming In Your baby will stay in your room with you for the entire time you are in the hospital. This helps with the following: . Allows Mom to learn baby's feeding cues - Fluttering eyes - Sucking on tongue or hand - Rooting (opens mouth and turns head) - Nuzzling into the breast - Bringing hand to mouth . Allows breastfeeding on demand (when your baby is ready) . Helps baby to be calm and content . Ensures a good milk supply . Prevents complications with breastfeeding . Allows parents to learn to care for baby . Allows you to request assistance with breastfeeding Importance of a good latch . Increases milk transfer to baby - baby gets enough milk . Ensures you have enough milk for your baby . Decreases nipple soreness . Don't use pacifiers and bottles - these cause baby to suck differently than breastfeeding . Promotes continuation of breastfeeding Risks of Formula Supplementation with Breastfeeding Giving your infant formula in addition to your breast-milk EXCEPT when medically necessary can lead to: Marland Kitchen Decreases your milk supply  . Loss of confidence in yourself for providing baby's nutrition  . Engorgement and possibly mastitis  . Asthma & allergies in the baby BREASTFEEDING FAQS How long should I breastfeed my baby? It is recommended that you provide your baby with breast milk only for the first 6 months and then continue for the first year and longer as desired. During the first few weeks after birth, your baby will need to feed 8-12 times every 24 hours, or every 2-3 hours. They will likely feed  for 15-30 minutes. How can I help my baby begin breastfeeding? Babies are born with an instinct to breastfeed. A healthy baby can begin breastfeeding right away without specific help. At the hospital, a nurse (or lactation consultant) will help you begin the process and will give you tips on good positioning. It may be  helpful to take a breastfeeding class before you deliver in order to know what to expect. How can I help my baby latch on? In order to assist your baby in latching-on, cup your breast in your hand and stroke your baby's lower lip with your nipple to stimulate your baby's rooting reflex. Your baby will look like he or she is yawning, at which point you should bring the baby towards your breast, while aiming the nipple at the roof of his or her mouth. Remember to bring the baby towards you and not your breast towards the baby. How can I tell if my baby is latched-on? Your baby will have all of your nipple and part of the dark area around the nipple in his or her mouth and your baby's nose will be touching your breast. You should see or hear the baby swallowing. If the baby is not latched-on properly, start the process over. To remove the suction, insert a clean finger between your breast and the baby's mouth. Should I switch breasts during feeding? After feeding on one side, switch the baby to your other breast. If he or she does not continue feeding - that is OK. Your baby will not necessarily need to feed from both breasts in a single feeding. On the next feeding, start with the other breast for efficiency and comfort. How can I tell if my baby is hungry? When your baby is hungry, they will nuzzle against your breast, make sucking noises and tongue motions and may put their hands near their mouth. Crying is a late sign of hunger, so you should not wait until this point. When they have received enough milk, they will unlatch from the breast. Is it okay to use a pacifier? Until your baby gets the hang of breastfeeding, experts recommend limiting pacifier usage. If you have questions about this, please contact your pediatrician. What can I do to ensure proper nutrition while breastfeeding? . Make sure that you support your own health and your baby's by eating a healthy, well-balanced diet . Your provider  may recommend that you continue to take your prenatal vitamin . Drink plenty of fluids. It is a good rule to drink one glass of water before or after feeding . Alcohol will remain in the breast milk for as long as it will remain in the blood stream. If you choose to have a drink, it is recommended that you wait at least 2 hours before feeding . Moderate amounts of caffeine are OK . Some over-the-counter or prescription medications are not recommended during breastfeeding. Check with your provider if you have questions What types of birth control methods are safe while breastfeeding? Progestin-only methods, including a daily pill, an IUD, the implant and the injection are safe while breastfeeding. Methods that contain estrogen (such as combination birth control pills, the vaginal ring and the patch) should not be used during the first month of breastfeeding as these can decrease your milk supply.  AREA PEDIATRIC/FAMILY [place] [address] [place], [place]  [address] Phone - [phone]   Fax - [phone]  [place]  Lorretta Harp [address], [place] [address] Phone - [phone]   Fax - [place] [address] [place], [place]  [address] Phone - [phone]   Fax - [phone]  [place]. [address] [place] [address] [place], [place]  [address] Phone - [phone]   Fax - [phone]  [place] [address] [place], [place]  [address] Phone - [phone]   Fax - [phone]  [facility] [address] [place], [place]  [address] Phone - [phone]   Fax - [place] [address], [place], [place]  [address] Phone - [phone]   Fax - [phone]  [place] [address] [place], [place] [address] [place], [place]  [address] Phone - [phone]   Fax -  [place] [address] [place], [place]  [address] Phone - [phone]   Fax - [phone] [facility] [place]. [address] [place], [place]  [address] Phone - [phone]   Fax - [phone]  [place] Webber [address] [place], [place]  [address] Phone - [phone]   Fax - [phone]  [facility] AT [place], [place], [place]  [address] Phone - [phone]   Fax - [place] [address] [place], [place]  [address] Phone - [phone]   Fax - [phone]  [facility] [address] [place], [place]  [address] Phone - [place], [place]  [address] Phone - [phone]   Fax - [place] [address] [place], [place]  [address] Phone - [phone]   Fax - [phone]  [place] [address] [place], [place]  [address] Phone - [phone]   Fax - [place]. [place], [place]  [address] Phone - [phone]   Fax - [place], [place], [place]  [address] Phone - [phone]   Fax - [place] [address], [place], [place]  [address] Phone - [phone]   Fax - [phone]  DAVID RUBIN [address] [place], [place]  [address] Phone - [phone]   Fax - [place] [address] [place], [place]  [address] Phone - [phone]   Fax - [phone]  [place] [address] [place], [place]  [address] Phone - [phone]   Fax - [phone] Arnaldo Natal [address] [place],   [address] Phone - [phone]   Fax -  [place] [address] [place],   [address] Phone - [phone]   Fax - [phone]  [place] [address] [place],   [address] Phone - [phone]   Fax - [phone]  [place] MD [address] [place] [address] Phone [phone]  Fax [phone]  Childbirth Education Options: [facility] Suite Department Classes:  Childbirth education classes can help you get ready for a positive parenting experience. You can also meet other expectant parents and get free stuff for your baby. Each class runs for five weeks on the same night and costs $  36 for the mother-to-be and her support person. Medicaid covers the cost if you are eligible. Call [phone] to register. [facility] Childbirth Education:  ([phone] or [phone] or [email or web address]  Baby & Me Class: Discuss newborn & infant parenting and family adjustment issues with other new mothers in a relaxed environment. Each week brings a new speaker or baby-centered activity. We encourage new mothers to join [place] every Thursday at 11:00am. Babies birth until crawling. No registration or fee. Daddy [employer]: This course offers Dads-to-be the tools and knowledge needed to feel confident on their journey to becoming new fathers. Experienced dads, who have been trained as coaches, teach dads-to-be how to hold, comfort, diaper, swaddle and play with their infant while being able to support the new mom as well. A class for men taught by men. $25/dad Big Brother/Big Sister: Let your children share in the joy of a new brother or sister in this special class designed just for them. Class includes discussion about how families care for babies: swaddling, holding, diapering, safety as well as how they can be helpful in their new role. This class is designed for children ages 19 to  16, but any age is welcome. Please register each child individually. $5/child  Mom Talk: This mom-led group offers support and connection to mothers as they journey through the adjustments and struggles of that sometimes overwhelming first year after the birth of a child. Tuesdays at 10:00am and Thursdays at 6:00pm. Babies welcome. No registration or fee. Breastfeeding Support Group: This group is a mother-to-mother support circle where moms have the opportunity to share their breastfeeding experiences. A Lactation Consultant is present for questions and concerns. Meets each Tuesday at 11:00am. No fee or registration. Breastfeeding Your Baby: Learn what to expect in the first days of breastfeeding your newborn.  This class will help you feel more confident with the skills needed to begin your breastfeeding experience. Many new mothers are concerned about breastfeeding after leaving the hospital. This class will also address the most common fears and challenges about breastfeeding during the first few weeks, months and beyond. (call for fee) Comfort Techniques and Tour: This 2 hour interactive class will provide you the opportunity to learn & practice hands-on techniques that can help relieve some of the discomfort of labor and encourage your baby to rotate toward the best position for birth. You and your partner will be able to try a variety of labor positions with birth balls and rebozos as well as practice breathing, relaxation, and visualization techniques. A tour of the [facility] is included with this class. $20 per registrant and support person Childbirth Class- Weekend Option: This class is a Weekend version of our Birth & Baby series. It is designed for parents who have a difficult time fitting several weeks of classes into their schedule. It covers the care of your newborn and the basics of labor and childbirth. It also includes a [facility] and lunch. The class is held two consecutive days: beginning on Friday evening from 6:30 - 8:30 p.m. and the next day, Saturday from 9 a.m. - 4 p.m. (call for fee) Doren Custard Class: Interested in a waterbirth?  This informational class will help you discover whether waterbirth is the right fit for you. Education about waterbirth itself, supplies you would need and how to assemble your support team is what you can expect from this class. Some obstetrical practices require this class in order to pursue  a waterbirth. (Not all obstetrical practices offer waterbirth-check with your healthcare provider.) Register only the expectant mom, but you are encouraged to bring your partner to class! Required if planning waterbirth, no fee. Infant/Child CPR: Parents, grandparents, babysitters, and friends learn Cardio-Pulmonary Resuscitation skills for infants and children. You will also learn how to treat both conscious and unconscious choking in infants and children. This Family & Friends program does not offer certification. Register each participant individually to ensure that enough mannequins are available. (Call for fee) Grandparent Love: Expecting a grandbaby? This class is for you! Learn about the latest infant care and safety recommendations and ways to support your own child as he or she transitions into the parenting role. Taught by Registered Nurses who are childbirth instructors, but most importantly...they are grandmothers too! $10/person. Childbirth Class- Natural Childbirth: This series of 5 weekly classes is for expectant parents who want to learn and practice natural methods of coping with the process of labor and childbirth. Relaxation, breathing, massage, visualization, role of the partner, and helpful positioning are highlighted. Participants learn how to be confident in their body's ability to give birth. This class will empower and help parents make informed decisions about their own care.  Includes discussion that will help new parents transition into the immediate postpartum period. [facility] is included. We suggest taking this class between 25-32 weeks, but it's only a recommendation. $75 per registrant and one support person or $30 Medicaid. Childbirth Class- 3 week Series: This option of 3 weekly classes helps you and your labor partner prepare for childbirth. Newborn care, labor & birth, cesarean birth, pain management, and comfort techniques are discussed and a [place] [facility] is included. The class meets at the same time, on the same day of the week for 3 consecutive weeks beginning with the starting date you choose. $60 for registrant and one support person.  Marvelous Multiples: Expecting twins, triplets, or more? This class covers the differences in labor, birth, parenting, and breastfeeding issues that face multiples' parents. NICU tour is included. Led by a Certified Childbirth Educator who is the mother of twins. No fee. Caring for Baby: This class is for expectant and adoptive parents who want to learn and practice the most up-to-date newborn care for their babies. Focus is on birth through the first six weeks of life. Topics include feeding, bathing, diapering, crying, umbilical cord care, circumcision care and safe sleep. Parents learn to recognize symptoms of illness and when to call the pediatrician. Register only the mom-to-be and your partner or support person can plan to come with you! $10 per registrant and support person Childbirth Class- online option: This online class offers you the freedom to complete a Birth and Baby series in the comfort of your own home. The flexibility of this option allows you to review sections at your own pace, at times convenient to you and your support people. It includes additional video information, animations, quizzes, and extended activities. Get organized with helpful  eClass tools, checklists, and trackers. Once you register online for the class, you will receive an email within a few days to accept the invitation and begin the class when the time is right for you. The content will be available to you for 60 days. $60 for 60 days of online access for you and your support people.  Local Doulas: Natural Baby Doulas [email or web address] Tel: [phone] [email or web address] [email or web address] [phone] [email or web address] The Labor Ladies  (also  do waterbirth tub rental) [phone] [email or web address] [email or web address] Birth Doula [phone] [email or web address].fi [place] [email or web address]/ [employer] (PADA) [email or web address]://[email or web address] [place] Birth and Baby  [email or web address]/ Considering Waterbirth? Guide for patients at [facility] [employer]  Why consider waterbirth?  . Gentle birth for babies . Less pain medicine used in labor . May allow for passive descent/less pushing . May reduce perineal tears  . More mobility and instinctive maternal position changes . Increased maternal relaxation . Reduced blood pressure in labor  Is waterbirth safe? What are the risks of infection, drowning or other complications?  . Infection: o Very low risk (3.7 % for tub vs 4.8% for bed) o 7 in 8000 waterbirths with documented infection o Poorly cleaned equipment most common cause o Slightly lower group B strep transmission rate  . Drowning o Maternal:  - Very low risk   - Related to seizures or fainting o Newborn:  - Very low risk. No evidence of increased risk of respiratory problems in multiple large studies - Physiological protection from breathing under water - Avoid underwater birth if there are any fetal  complications - Once baby's head is out of the water, keep it out.  . Birth complication o Some reports of cord trauma, but risk decreased by bringing baby to surface gradually o No evidence of increased risk of shoulder dystocia. Mothers can usually change positions faster in water than in a bed, possibly aiding the maneuvers to free the shoulder.   You must attend a Doren Custard class at [facility]  3rd Wednesday of every month from 7-9pm  [employer] by calling [phone] or online at [email or web address]  Bring [place] the certificate from the class to your prenatal appointment  Meet with a midwife at 36 weeks to see if you can still plan a waterbirth and to sign the consent.   Purchase or rent the following supplies:   Water Birth Pool (Birth Pool in a Box or Bowman for instance)  (Tubs start ~$125)  Single-use disposable tub liner designed for your brand of tub  New garden hose labeled "lead-free", "suitable for drinking water",  Electric drain pump to remove water (We recommend 792 gallon per hour or greater pump.)   Separate garden hose to remove the dirty water  Fish net  Bathing suit top (optional)  Long-handled mirror (optional)  Places to purchase or rent supplies  GotWebTools.is for tub purchases and supplies  Waterbirthsolutions.com for tub purchases and supplies  The Labor Ladies (www.[email or web address]) $275 for tub rental/set-up & take down/kit   [employer] (http://www.fleming.com/.htm) Information regarding doulas (labor support) who provide pool rentals  Our practice has a Birth Pool in a Box tub at the hospital that you may borrow on a first-come-first-served basis. It is your responsibility to to set up, clean and break down the tub. We cannot guarantee the availability of this tub in advance. You are responsible for bringing all accessories listed above. If you do not have all necessary supplies you cannot  have a waterbirth.    Things that would prevent you from having a waterbirth:  Premature, <37wks  Previous cesarean birth  Presence of thick meconium-stained fluid  Multiple gestation (Twins, triplets, etc.)  Uncontrolled diabetes or gestational diabetes requiring medication  Hypertension requiring medication or diagnosis of pre-eclampsia  Heavy vaginal bleeding  Non-reassuring fetal heart rate  Active infection (MRSA, etc.). Group B Strep is NOT a contraindication for  waterbirth.  If your labor has  to be induced and induction method requires continuous  monitoring of the baby's heart rate  Other risks/issues identified by your obstetrical provider  Please remember that birth is unpredictable. Under certain unforeseeable circumstances your provider may advise against giving birth in the tub. These decisions will be made on a case-by-case basis and with the safety of you and your baby as our highest priority.

## 2017-11-02 NOTE — Progress Notes (Signed)
Offered tdap, given handout.

## 2017-11-02 NOTE — Progress Notes (Signed)
   PRENATAL VISIT NOTE  Subjective:  Amanda Bright is a 22 y.o. G1P0000 at 1676w3d being seen today for ongoing prenatal care.  She is currently monitored for the following issues for this low-risk pregnancy and has Supervision of normal first pregnancy, antepartum; [redacted] weeks gestation of pregnancy; and Evaluate anatomy not seen on prior sonogram on their problem list.  Patient reports no complaints.  Contractions: Not present. Vag. Bleeding: None.  Movement: Present. Denies leaking of fluid.   The following portions of the patient's history were reviewed and updated as appropriate: allergies, current medications, past family history, past medical history, past social history, past surgical history and problem list. Problem list updated.  Objective:   Vitals:   11/02/17 0939  BP: 106/62  Pulse: 69  Weight: 141 lb 3.2 oz (64 kg)    Fetal Status: Fetal Heart Rate (bpm): 139 Fundal Height: 29 cm Movement: Present     General:  Alert, oriented and cooperative. Patient is in no acute distress.  Skin: Skin is warm and dry. No rash noted.   Cardiovascular: Normal heart rate noted  Respiratory: Normal respiratory effort, no problems with respiration noted  Abdomen: Soft, gravid, appropriate for gestational age.  Pain/Pressure: Present     Pelvic: Cervical exam deferred        Extremities: Normal range of motion.  Edema: Trace  Mental Status: Normal mood and affect. Normal behavior. Normal judgment and thought content.   Assessment and Plan:  Pregnancy: G1P0000 at 176w3d  1. Supervision of normal first pregnancy, antepartum - Routine care  Preterm labor symptoms and general obstetric precautions including but not limited to vaginal bleeding, contractions, leaking of fluid and fetal movement were reviewed in detail with the patient. Please refer to After Visit Summary for other counseling recommendations.  Return in about 2 weeks (around 11/16/2017).  No future appointments.  Thressa ShellerHeather  Bisma Klett, CNM

## 2017-11-16 ENCOUNTER — Encounter: Payer: Self-pay | Admitting: Family Medicine

## 2017-11-16 ENCOUNTER — Ambulatory Visit (INDEPENDENT_AMBULATORY_CARE_PROVIDER_SITE_OTHER): Payer: Self-pay | Admitting: Advanced Practice Midwife

## 2017-11-16 ENCOUNTER — Encounter: Payer: Self-pay | Admitting: Advanced Practice Midwife

## 2017-11-16 VITALS — BP 109/67 | HR 70 | Wt 141.1 lb

## 2017-11-16 DIAGNOSIS — Z3403 Encounter for supervision of normal first pregnancy, third trimester: Secondary | ICD-10-CM

## 2017-11-16 DIAGNOSIS — Z3400 Encounter for supervision of normal first pregnancy, unspecified trimester: Secondary | ICD-10-CM

## 2017-11-16 NOTE — Progress Notes (Signed)
   PRENATAL VISIT NOTE  Subjective:  Amanda Bright is a 22 y.o. G1P0000 at 2024w3d being seen today for ongoing prenatal care.  She is currently monitored for the following issues for this low-risk pregnancy and has Supervision of normal first pregnancy, antepartum on their problem list.  Patient reports no complaints.  Contractions: Not present. Vag. Bleeding: None.  Movement: Present. Denies leaking of fluid.   The following portions of the patient's history were reviewed and updated as appropriate: allergies, current medications, past family history, past medical history, past social history, past surgical history and problem list. Problem list updated.  Objective:   Vitals:   11/16/17 1000  BP: 109/67  Pulse: 70  Weight: 141 lb 1.6 oz (64 kg)    Fetal Status: Fetal Heart Rate (bpm): 141 Fundal Height: 31 cm Movement: Present     General:  Alert, oriented and cooperative. Patient is in no acute distress.  Skin: Skin is warm and dry. No rash noted.   Cardiovascular: Normal heart rate noted  Respiratory: Normal respiratory effort, no problems with respiration noted  Abdomen: Soft, gravid, appropriate for gestational age.  Pain/Pressure: Present     Pelvic: Cervical exam deferred        Extremities: Normal range of motion.  Edema: Trace  Mental Status: Normal mood and affect. Normal behavior. Normal judgment and thought content.   Assessment and Plan:  Pregnancy: G1P0000 at 6824w3d  1. Supervision of normal first pregnancy, antepartum - Routine care   Preterm labor symptoms and general obstetric precautions including but not limited to vaginal bleeding, contractions, leaking of fluid and fetal movement were reviewed in detail with the patient. Please refer to After Visit Summary for other counseling recommendations.  Return in about 2 weeks (around 11/30/2017).  No future appointments.  Thressa ShellerHeather Jeanine Caven, CNM

## 2017-11-16 NOTE — Patient Instructions (Addendum)
BENEFITS OF BREASTFEEDING Many women wonder if they should breastfeed. Research shows that breast milk contains the perfect balance of vitamins, protein and fat that your baby needs to grow. It also contains antibodies that help your baby's immune system to fight off viruses and bacteria and can reduce the risk of sudden infant death syndrome (SIDS). In addition, the colostrum (a fluid secreted from the breast in the first few days after delivery) helps your newborn's digestive system to grow and function well. Breast milk is easier to digest than formula. Also, if your baby is born preterm, breast milk can help to reduce both short- and long-term health problems. BENEFITS OF BREASTFEEDING FOR MOM . Breastfeeding causes a hormone to be released that helps the uterus to contract and return to its normal size more quickly. . It aids in postpartum weight loss, reduces risk of breast and ovarian cancer, heart disease and rheumatoid arthritis. . It decreases the amount of bleeding after the baby is born. benefits of breastfeeding for baby . Provides comfort and nutrition . Protects baby against - Obesity - Diabetes - Asthma - Childhood cancers - Heart disease - Ear infections - Diarrhea - Pneumonia - Stomach problems - Serious allergies - Skin rashes . Promotes growth and development . Reduces the risk of baby having Sudden Infant Death Syndrome (SIDS) only breastmilk for the first 6 months . Protects baby against diseases/allergies . It's the perfect amount for tiny bellies . It restores baby's energy . Provides the best nutrition for baby . Giving water or formula can make baby more likely to get sick, decrease Mom's milk supply, make baby less content with breastfeeding Skin to Skin After delivery, the staff will place your baby on your chest. This helps with the following: . Regulates baby's temperature, breathing, heart rate and blood sugar . Increases Mom's milk supply . Promotes  bonding . Keeps baby and Mom calm and decreases baby's crying Rooming In Your baby will stay in your room with you for the entire time you are in the hospital. This helps with the following: . Allows Mom to learn baby's feeding cues - Fluttering eyes - Sucking on tongue or hand - Rooting (opens mouth and turns head) - Nuzzling into the breast - Bringing hand to mouth . Allows breastfeeding on demand (when your baby is ready) . Helps baby to be calm and content . Ensures a good milk supply . Prevents complications with breastfeeding . Allows parents to learn to care for baby . Allows you to request assistance with breastfeeding Importance of a good latch . Increases milk transfer to baby - baby gets enough milk . Ensures you have enough milk for your baby . Decreases nipple soreness . Don't use pacifiers and bottles - these cause baby to suck differently than breastfeeding . Promotes continuation of breastfeeding Risks of Formula Supplementation with Breastfeeding Giving your infant formula in addition to your breast-milk EXCEPT when medically necessary can lead to: . Decreases your milk supply  . Loss of confidence in yourself for providing baby's nutrition  . Engorgement and possibly mastitis  . Asthma & allergies in the baby BREASTFEEDING FAQS How long should I breastfeed my baby? It is recommended that you provide your baby with breast milk only for the first 6 months and then continue for the first year and longer as desired. During the first few weeks after birth, your baby will need to feed 8-12 times every 24 hours, or every 2-3 hours. They will likely feed   for 15-30 minutes. How can I help my baby begin breastfeeding? Babies are born with an instinct to breastfeed. A healthy baby can begin breastfeeding right away without specific help. At the hospital, a nurse (or lactation consultant) will help you begin the process and will give you tips on good positioning. It may be  helpful to take a breastfeeding class before you deliver in order to know what to expect. How can I help my baby latch on? In order to assist your baby in latching-on, cup your breast in your hand and stroke your baby's lower lip with your nipple to stimulate your baby's rooting reflex. Your baby will look like he or she is yawning, at which point you should bring the baby towards your breast, while aiming the nipple at the roof of his or her mouth. Remember to bring the baby towards you and not your breast towards the baby. How can I tell if my baby is latched-on? Your baby will have all of your nipple and part of the dark area around the nipple in his or her mouth and your baby's nose will be touching your breast. You should see or hear the baby swallowing. If the baby is not latched-on properly, start the process over. To remove the suction, insert a clean finger between your breast and the baby's mouth. Should I switch breasts during feeding? After feeding on one side, switch the baby to your other breast. If he or she does not continue feeding - that is OK. Your baby will not necessarily need to feed from both breasts in a single feeding. On the next feeding, start with the other breast for efficiency and comfort. How can I tell if my baby is hungry? When your baby is hungry, they will nuzzle against your breast, make sucking noises and tongue motions and may put their hands near their mouth. Crying is a late sign of hunger, so you should not wait until this point. When they have received enough milk, they will unlatch from the breast. Is it okay to use a pacifier? Until your baby gets the hang of breastfeeding, experts recommend limiting pacifier usage. If you have questions about this, please contact your pediatrician. What can I do to ensure proper nutrition while breastfeeding? . Make sure that you support your own health and your baby's by eating a healthy, well-balanced diet . Your provider  may recommend that you continue to take your prenatal vitamin . Drink plenty of fluids. It is a good rule to drink one glass of water before or after feeding . Alcohol will remain in the breast milk for as long as it will remain in the blood stream. If you choose to have a drink, it is recommended that you wait at least 2 hours before feeding . Moderate amounts of caffeine are OK . Some over-the-counter or prescription medications are not recommended during breastfeeding. Check with your provider if you have questions What types of birth control methods are safe while breastfeeding? Progestin-only methods, including a daily pill, an IUD, the implant and the injection are safe while breastfeeding. Methods that contain estrogen (such as combination birth control pills, the vaginal ring and the patch) should not be used during the first month of breastfeeding as these can decrease your milk supply.  Rooming In Your baby will stay in your room with you for the entire time you are in the hospital. This helps with the following: . Allows Mom to learn baby's feeding cues -  Fluttering eyes - Sucking on tongue or hand - Rooting (opens mouth and turns head) - Nuzzling into the breast - Bringing hand to mouth . Allows breastfeeding on demand (when your baby is ready) . Helps baby to be calm and content . Ensures a good milk supply . Prevents complications with breastfeeding . Allows parents to learn to care for baby . Allows you to request assistance with breastfeeding

## 2017-12-01 ENCOUNTER — Ambulatory Visit (INDEPENDENT_AMBULATORY_CARE_PROVIDER_SITE_OTHER): Payer: Self-pay | Admitting: Student

## 2017-12-01 VITALS — BP 112/78 | HR 78 | Wt 146.3 lb

## 2017-12-01 DIAGNOSIS — Z3400 Encounter for supervision of normal first pregnancy, unspecified trimester: Secondary | ICD-10-CM

## 2017-12-01 NOTE — Progress Notes (Signed)
   PRENATAL VISIT NOTE  Subjective:  Amanda Bright is a 22 y.o. G1P0000 at 2641w4d being seen today for ongoing prenatal care.  She is currently monitored for the following issues for this low-risk pregnancy and has Supervision of normal first pregnancy, antepartum on their problem list.  Patient reports no complaints.  Contractions: Not present. Vag. Bleeding: None.  Movement: Present. Denies leaking of fluid.   The following portions of the patient's history were reviewed and updated as appropriate: allergies, current medications, past family history, past medical history, past social history, past surgical history and problem list. Problem list updated.  Objective:   Vitals:   12/01/17 0827  BP: 112/78  Pulse: 78  Weight: 146 lb 4.8 oz (66.4 kg)    Fetal Status: Fetal Heart Rate (bpm): 138 Fundal Height: 33 cm Movement: Present     General:  Alert, oriented and cooperative. Patient is in no acute distress.  Skin: Skin is warm and dry. No rash noted.   Cardiovascular: Normal heart rate noted  Respiratory: Normal respiratory effort, no problems with respiration noted  Abdomen: Soft, gravid, appropriate for gestational age.  Pain/Pressure: Present     Pelvic: Cervical exam deferred        Extremities: Normal range of motion.  Edema: Mild pitting, slight indentation  Mental Status: Normal mood and affect. Normal behavior. Normal judgment and thought content.   Assessment and Plan:  Pregnancy: G1P0000 at 10241w4d  1. Supervision of normal first pregnancy, antepartum    -Patient doing well; no complaints. Reviewed BC options; reassured her that some swelling in her feet is normal.   Preterm labor symptoms and general obstetric precautions including but not limited to vaginal bleeding, contractions, leaking of fluid and fetal movement were reviewed in detail with the patient. Please refer to After Visit Summary for other counseling recommendations.  Return in about 2 weeks (around  12/15/2017), or [facility].  Future Appointments  Date Time Provider Department Center  12/14/2017 10:15 AM Armando ReichertHogan, Heather D, CNM [facility]  12/21/2017  8:35 AM Armando ReichertHogan, Heather D, CNM [facility]  12/28/2017  8:55 AM Armando ReichertHogan, Heather D, CNM [facility]    [place]manda [place], [place]

## 2017-12-01 NOTE — Patient Instructions (Signed)

## 2017-12-14 ENCOUNTER — Encounter: Payer: Self-pay | Admitting: Advanced Practice Midwife

## 2017-12-14 ENCOUNTER — Ambulatory Visit (INDEPENDENT_AMBULATORY_CARE_PROVIDER_SITE_OTHER): Payer: Self-pay | Admitting: Advanced Practice Midwife

## 2017-12-14 VITALS — BP 121/77 | HR 64 | Wt 147.0 lb

## 2017-12-14 DIAGNOSIS — Z3400 Encounter for supervision of normal first pregnancy, unspecified trimester: Secondary | ICD-10-CM

## 2017-12-14 NOTE — Progress Notes (Signed)
   PRENATAL VISIT NOTE  Subjective:  Amanda Bright is a 22 y.o. G1P0000 at 7377w3d being seen today for ongoing prenatal care.  She is currently monitored for the following issues for this low-risk pregnancy and has Supervision of normal first pregnancy, antepartum on their problem list.  Patient reports no complaints.  Contractions: Irritability. Vag. Bleeding: None.  Movement: Present. Denies leaking of fluid.   The following portions of the patient's history were reviewed and updated as appropriate: allergies, current medications, past family history, past medical history, past social history, past surgical history and problem list. Problem list updated.  Objective:   Vitals:   12/14/17 1046  BP: 121/77  Pulse: 64  Weight: 147 lb (66.7 kg)    Fetal Status: Fetal Heart Rate (bpm): 158 Fundal Height: 35 cm Movement: Present     General:  Alert, oriented and cooperative. Patient is in no acute distress.  Skin: Skin is warm and dry. No rash noted.   Cardiovascular: Normal heart rate noted  Respiratory: Normal respiratory effort, no problems with respiration noted  Abdomen: Soft, gravid, appropriate for gestational age.  Pain/Pressure: Present     Pelvic: Cervical exam deferred        Extremities: Normal range of motion.  Edema: Trace  Mental Status: Normal mood and affect. Normal behavior. Normal judgment and thought content.   Assessment and Plan:  Pregnancy: G1P0000 at 3077w3d  1. Supervision of normal first pregnancy, antepartum - GBS at next visit   Preterm labor symptoms and general obstetric precautions including but not limited to vaginal bleeding, contractions, leaking of fluid and fetal movement were reviewed in detail with the patient. Please refer to After Visit Summary for other counseling recommendations.  Return in about 1 week (around 12/21/2017).  Future Appointments  Date Time Provider Department Center  12/21/2017  8:35 AM Armando ReichertHogan, Aasiya Creasey D, CNM [facility]    12/28/2017  8:55 AM Armando ReichertHogan, Adana Marik D, CNM [facility]    Thressa ShellerHeather Madisson Kulaga, CNM

## 2017-12-21 ENCOUNTER — Encounter: Payer: Self-pay | Admitting: Advanced Practice Midwife

## 2017-12-28 ENCOUNTER — Ambulatory Visit (INDEPENDENT_AMBULATORY_CARE_PROVIDER_SITE_OTHER): Payer: Self-pay | Admitting: Advanced Practice Midwife

## 2017-12-28 ENCOUNTER — Encounter: Payer: Self-pay | Admitting: Advanced Practice Midwife

## 2017-12-28 VITALS — BP 132/87 | HR 71 | Wt 154.0 lb

## 2017-12-28 DIAGNOSIS — Z113 Encounter for screening for infections with a predominantly sexual mode of transmission: Secondary | ICD-10-CM

## 2017-12-28 DIAGNOSIS — Z3400 Encounter for supervision of normal first pregnancy, unspecified trimester: Secondary | ICD-10-CM

## 2017-12-28 LAB — OB RESULTS CONSOLE GBS: STREP GROUP B AG: NEGATIVE

## 2017-12-28 NOTE — Patient Instructions (Signed)
Vaginal delivery means that you will give birth by pushing your baby out of your birth canal (vagina). A team of health care providers will help you before, during, and after vaginal delivery. Birth experiences are unique for every woman and every pregnancy, and birth experiences vary depending on where you choose to give birth. What should I do to prepare for my baby's birth? Before your baby is born, it is important to talk with your health care provider about:  Your labor and delivery preferences. These may include: ? Medicines that you may be given. ? How you will manage your pain. This might include non-medical pain relief techniques or injectable pain relief such as epidural analgesia. ? How you and your baby will be monitored during labor and delivery. ? Who may be in the labor and delivery room with you. ? Your feelings about surgical delivery of your baby (cesarean delivery, or C-section) if this becomes necessary. ? Your feelings about receiving donated blood through an IV tube (blood transfusion) if this becomes necessary.  Whether you are able: ? To take pictures or videos of the birth. ? To eat during labor and delivery. ? To move around, walk, or change positions during labor and delivery.  What to expect after your baby is born, such as: ? Whether delayed umbilical cord clamping and cutting is offered. ? Who will care for your baby right after birth. ? Medicines or tests that may be recommended for your baby. ? Whether breastfeeding is supported in your hospital or birth center. ? How long you will be in the hospital or birth center.  How any medical conditions you have may affect your baby or your labor and delivery experience.  To prepare for your baby's birth, you should also:  Attend all of your health care visits before delivery (prenatal visits) as recommended by your health care provider. This is important.  Prepare your home for your baby's arrival. Make sure  that you have: ? Diapers. ? Baby clothing. ? Feeding equipment. ? Safe sleeping arrangements for you and your baby.  Install a car seat in your vehicle. Have your car seat checked by a certified car seat installer to make sure that it is installed safely.  Think about who will help you with your new baby at home for at least the first several weeks after delivery.  What can I expect when I arrive at the birth center or hospital? Once you are in labor and have been admitted into the hospital or birth center, your health care provider may:  Review your pregnancy history and any concerns you have.  Insert an IV tube into one of your veins. This is used to give you fluids and medicines.  Check your blood pressure, pulse, temperature, and heart rate (vital signs).  Check whether your bag of water (amniotic sac) has broken (ruptured).  Talk with you about your birth plan and discuss pain control options.  Monitoring Your health care provider may monitor your contractions (uterine monitoring) and your baby's heart rate (fetal monitoring). You may need to be monitored:  Often, but not continuously (intermittently).  All the time or for long periods at a time (continuously). Continuous monitoring may be needed if: ? You are taking certain medicines, such as medicine to relieve pain or make your contractions stronger. ? You have pregnancy or labor complications.  Monitoring may be done by:  Placing a special stethoscope or a handheld monitoring device on your abdomen to check your   baby's heartbeat, and feeling your abdomen for contractions. This method of monitoring does not continuously record your baby's heartbeat or your contractions.  Placing monitors on your abdomen (external monitors) to record your baby's heartbeat and the frequency and length of contractions. You may not have to wear external monitors all the time.  Placing monitors inside of your uterus (internal monitors) to  record your baby's heartbeat and the frequency, length, and strength of your contractions. ? Your health care provider may use internal monitors if he or she needs more information about the strength of your contractions or your baby's heart rate. ? Internal monitors are put in place by passing a thin, flexible wire through your vagina and into your uterus. Depending on the type of monitor, it may remain in your uterus or on your baby's head until birth. ? Your health care provider will discuss the benefits and risks of internal monitoring with you and will ask for your permission before inserting the monitors.  Telemetry. This is a type of continuous monitoring that can be done with external or internal monitors. Instead of having to stay in bed, you are able to move around during telemetry. Ask your health care provider if telemetry is an option for you.  Physical exam Your health care provider may perform a physical exam. This may include:  Checking whether your baby is positioned: ? With the head toward your vagina (head-down). This is most common. ? With the head toward the top of your uterus (head-up or breech). If your baby is in a breech position, your health care provider may try to turn your baby to a head-down position so you can deliver vaginally. If it does not seem that your baby can be born vaginally, your provider may recommend surgery to deliver your baby. In rare cases, you may be able to deliver vaginally if your baby is head-up (breech delivery). ? Lying sideways (transverse). Babies that are lying sideways cannot be delivered vaginally.  Checking your cervix to determine: ? Whether it is thinning out (effacing). ? Whether it is opening up (dilating). ? How low your baby has moved into your birth canal.  What are the three stages of labor and delivery?  Normal labor and delivery is divided into the following three stages: Stage 1  Stage 1 is the longest stage of labor,  and it can last for hours or days. Stage 1 includes: ? Early labor. This is when contractions may be irregular, or regular and mild. Generally, early labor contractions are more than 10 minutes apart. ? Active labor. This is when contractions get longer, more regular, more frequent, and more intense. ? The transition phase. This is when contractions happen very close together, are very intense, and may last longer than during any other part of labor.  Contractions generally feel mild, infrequent, and irregular at first. They get stronger, more frequent (about every 2-3 minutes), and more regular as you progress from early labor through active labor and transition.  Many women progress through stage 1 naturally, but you may need help to continue making progress. If this happens, your health care provider may talk with you about: ? Rupturing your amniotic sac if it has not ruptured yet. ? Giving you medicine to help make your contractions stronger and more frequent.  Stage 1 ends when your cervix is completely dilated to 4 inches (10 cm) and completely effaced. This happens at the end of the transition phase. Stage 2  Once your cervix   is completely effaced and dilated to 4 inches (10 cm), you may start to feel an urge to push. It is common for the body to naturally take a rest before feeling the urge to push, especially if you received an epidural or certain other pain medicines. This rest period may last for up to 1-2 hours, depending on your unique labor experience.  During stage 2, contractions are generally less painful, because pushing helps relieve contraction pain. Instead of contraction pain, you may feel stretching and burning pain, especially when the widest part of your baby's head passes through the vaginal opening (crowning).  Your health care provider will closely monitor your pushing progress and your baby's progress through the vagina during stage 2.  Your health care provider may  massage the area of skin between your vaginal opening and anus (perineum) or apply warm compresses to your perineum. This helps it stretch as the baby's head starts to crown, which can help prevent perineal tearing. ? In some cases, an incision may be made in your perineum (episiotomy) to allow the baby to pass through the vaginal opening. An episiotomy helps to make the opening of the vagina larger to allow more room for the baby to fit through.  It is very important to breathe and focus so your health care provider can control the delivery of your baby's head. Your health care provider may have you decrease the intensity of your pushing, to help prevent perineal tearing.  After delivery of your baby's head, the shoulders and the rest of the body generally deliver very quickly and without difficulty.  Once your baby is delivered, the umbilical cord may be cut right away, or this may be delayed for 1-2 minutes, depending on your baby's health. This may vary among health care providers, hospitals, and birth centers.  If you and your baby are healthy enough, your baby may be placed on your chest or abdomen to help maintain the baby's temperature and to help you bond with each other. Some mothers and babies start breastfeeding at this time. Your health care team will dry your baby and help keep your baby warm during this time.  Your baby may need immediate care if he or she: ? Showed signs of distress during labor. ? Has a medical condition. ? Was born too early (prematurely). ? Had a bowel movement before birth (meconium). ? Shows signs of difficulty transitioning from being inside the uterus to being outside of the uterus. If you are planning to breastfeed, your health care team will help you begin a feeding. Stage 3  The third stage of labor starts immediately after the birth of your baby and ends after you deliver the placenta. The placenta is an organ that develops during pregnancy to provide  oxygen and nutrients to your baby in the womb.  Delivering the placenta may require some pushing, and you may have mild contractions. Breastfeeding can stimulate contractions to help you deliver the placenta.  After the placenta is delivered, your uterus should tighten (contract) and become firm. This helps to stop bleeding in your uterus. To help your uterus contract and to control bleeding, your health care provider may: ? Give you medicine by injection, through an IV tube, by mouth, or through your rectum (rectally). ? Massage your abdomen or perform a vaginal exam to remove any blood clots that are left in your uterus. ? Empty your bladder by placing a thin, flexible tube (catheter) into your bladder. ? Encourage you to   breastfeed your baby. After labor is over, you and your baby will be monitored closely to ensure that you are both healthy until you are ready to go home. Your health care team will teach you how to care for yourself and your baby. This information is not intended to replace advice given to you by your health care provider. Make sure you discuss any questions you have with your health care provider. Document Released: 01/29/2008 Document Revised: 11/09/2015 Document Reviewed: 05/06/2015 Elsevier Interactive Patient Education  2018 [employer]  

## 2017-12-28 NOTE — Progress Notes (Signed)
   PRENATAL VISIT NOTE  Subjective:  Amanda Bright is a 22 y.o. G1P0000 at 248w3d being seen today for ongoing prenatal care.  She is currently monitored for the following issues for this low-risk pregnancy and has Supervision of normal first pregnancy, antepartum on their problem list.  Patient reports trouble sleeping.  Contractions: Irritability. Vag. Bleeding: None.  Movement: Present. Denies leaking of fluid.   The following portions of the patient's history were reviewed and updated as appropriate: allergies, current medications, past family history, past medical history, past social history, past surgical history and problem list. Problem list updated.  Objective:   Vitals:   12/28/17 0852  BP: 132/87  Pulse: 71  Weight: 154 lb (69.9 kg)    Fetal Status: Fetal Heart Rate (bpm): 154 Fundal Height: 36 cm Movement: Present  Presentation: Vertex  General:  Alert, oriented and cooperative. Patient is in no acute distress.  Skin: Skin is warm and dry. No rash noted.   Cardiovascular: Normal heart rate noted  Respiratory: Normal respiratory effort, no problems with respiration noted  Abdomen: Soft, gravid, appropriate for gestational age.  Pain/Pressure: Present     Pelvic: Cervical exam performed Dilation: 1 Effacement (%): Thick Station: Ballotable  Extremities: Normal range of motion.  Edema: Trace  Mental Status: Normal mood and affect. Normal behavior. Normal judgment and thought content.   Assessment and Plan:  Pregnancy: G1P0000 at 948w3d  1. Supervision of normal first pregnancy, antepartum - Routine care  - Culture, beta strep (group b only) - GC[facility]Chlamydia probe amp ([place]  Term labor symptoms and general obstetric precautions including but not limited to vaginal bleeding, contractions, leaking of fluid and fetal movement were reviewed in detail with the patient. Please refer to After Visit Summary for other counseling recommendations.  Return in  about 1 week (around 01/04/2018).  No future appointments.  Thressa ShellerHeather Ayson Cherubini, CNM

## 2017-12-29 LAB — GC/CHLAMYDIA PROBE AMP (~~LOC~~) NOT AT ARMC
CHLAMYDIA, DNA PROBE: NEGATIVE
Neisseria Gonorrhea: NEGATIVE

## 2018-01-01 LAB — CULTURE, BETA STREP (GROUP B ONLY): Strep Gp B Culture: NEGATIVE

## 2018-01-05 ENCOUNTER — Encounter: Payer: Self-pay | Admitting: Advanced Practice Midwife

## 2018-01-05 ENCOUNTER — Ambulatory Visit (INDEPENDENT_AMBULATORY_CARE_PROVIDER_SITE_OTHER): Payer: Self-pay | Admitting: Advanced Practice Midwife

## 2018-01-05 VITALS — BP 133/89 | HR 69 | Wt 155.1 lb

## 2018-01-05 DIAGNOSIS — Z3403 Encounter for supervision of normal first pregnancy, third trimester: Secondary | ICD-10-CM

## 2018-01-05 DIAGNOSIS — Z3400 Encounter for supervision of normal first pregnancy, unspecified trimester: Secondary | ICD-10-CM

## 2018-01-05 NOTE — Progress Notes (Signed)
   PRENATAL VISIT NOTE  Subjective:  Amanda Bright is a 22 y.o. G1P0000 at [redacted]w[redacted]d being seen today for ongoing prenatal care.  She is currently monitored for the following issues for this low-risk pregnancy and has Supervision of normal first pregnancy, antepartum on their problem list.  Patient reports no complaints.  Contractions: Irregular. Vag. Bleeding: None.  Movement: Present. Denies leaking of fluid.  Had some spotting after exam last week. Stopped on its own. Would like exam today.   The following portions of the patient's history were reviewed and updated as appropriate: allergies, current medications, past family history, past medical history, past social history, past surgical history and problem list. Problem list updated.  Objective:   Vitals:   01/05/18 1128  BP: 133/89  Pulse: 69  Weight: 155 lb 1.6 oz (70.4 kg)    Fetal Status: Fetal Heart Rate (bpm): 148 Fundal Height: 37 cm Movement: Present     General:  Alert, oriented and cooperative. Patient is in no acute distress.  Skin: Skin is warm and dry. No rash noted.   Cardiovascular: Normal heart rate noted  Respiratory: Normal respiratory effort, no problems with respiration noted  Abdomen: Soft, gravid, appropriate for gestational age.  Pain/Pressure: Present     Pelvic: Cervical exam deferred Dilation: 1 Effacement (%): Thick Station: Ballotable  Extremities: Normal range of motion.  Edema: Moderate pitting, indentation subsides rapidly  Mental Status: Normal mood and affect. Normal behavior. Normal judgment and thought content.   Assessment and Plan:  Pregnancy: G1P0000 at [redacted]w[redacted]d  1. Supervision of normal first pregnancy, antepartum - Routine care  Term labor symptoms and general obstetric precautions including but not limited to vaginal bleeding, contractions, leaking of fluid and fetal movement were reviewed in detail with the patient. Please refer to After Visit Summary for other counseling  recommendations.  Return in about 1 week (around 01/12/2018).  Future Appointments  Date Time Provider Department Center  01/11/2018 11:15 AM Armando Reichert, CNM [facility]    Thressa Sheller, CNM

## 2018-01-05 NOTE — Patient Instructions (Signed)
Vaginal delivery means that you will give birth by pushing your baby out of your birth canal (vagina). A team of health care providers will help you before, during, and after vaginal delivery. Birth experiences are unique for every woman and every pregnancy, and birth experiences vary depending on where you choose to give birth. What should I do to prepare for my baby's birth? Before your baby is born, it is important to talk with your health care provider about:  Your labor and delivery preferences. These may include: ? Medicines that you may be given. ? How you will manage your pain. This might include non-medical pain relief techniques or injectable pain relief such as epidural analgesia. ? How you and your baby will be monitored during labor and delivery. ? Who may be in the labor and delivery room with you. ? Your feelings about surgical delivery of your baby (cesarean delivery, or C-section) if this becomes necessary. ? Your feelings about receiving donated blood through an IV tube (blood transfusion) if this becomes necessary.  Whether you are able: ? To take pictures or videos of the birth. ? To eat during labor and delivery. ? To move around, walk, or change positions during labor and delivery.  What to expect after your baby is born, such as: ? Whether delayed umbilical cord clamping and cutting is offered. ? Who will care for your baby right after birth. ? Medicines or tests that may be recommended for your baby. ? Whether breastfeeding is supported in your hospital or birth center. ? How long you will be in the hospital or birth center.  How any medical conditions you have may affect your baby or your labor and delivery experience.  To prepare for your baby's birth, you should also:  Attend all of your health care visits before delivery (prenatal visits) as recommended by your health care provider. This is important.  Prepare your home for your baby's arrival. Make sure  that you have: ? Diapers. ? Baby clothing. ? Feeding equipment. ? Safe sleeping arrangements for you and your baby.  Install a car seat in your vehicle. Have your car seat checked by a certified car seat installer to make sure that it is installed safely.  Think about who will help you with your new baby at home for at least the first several weeks after delivery.  What can I expect when I arrive at the birth center or hospital? Once you are in labor and have been admitted into the hospital or birth center, your health care provider may:  Review your pregnancy history and any concerns you have.  Insert an IV tube into one of your veins. This is used to give you fluids and medicines.  Check your blood pressure, pulse, temperature, and heart rate (vital signs).  Check whether your bag of water (amniotic sac) has broken (ruptured).  Talk with you about your birth plan and discuss pain control options.  Monitoring Your health care provider may monitor your contractions (uterine monitoring) and your baby's heart rate (fetal monitoring). You may need to be monitored:  Often, but not continuously (intermittently).  All the time or for long periods at a time (continuously). Continuous monitoring may be needed if: ? You are taking certain medicines, such as medicine to relieve pain or make your contractions stronger. ? You have pregnancy or labor complications.  Monitoring may be done by:  Placing a special stethoscope or a handheld monitoring device on your abdomen to check your   baby's heartbeat, and feeling your abdomen for contractions. This method of monitoring does not continuously record your baby's heartbeat or your contractions.  Placing monitors on your abdomen (external monitors) to record your baby's heartbeat and the frequency and length of contractions. You may not have to wear external monitors all the time.  Placing monitors inside of your uterus (internal monitors) to  record your baby's heartbeat and the frequency, length, and strength of your contractions. ? Your health care provider may use internal monitors if he or she needs more information about the strength of your contractions or your baby's heart rate. ? Internal monitors are put in place by passing a thin, flexible wire through your vagina and into your uterus. Depending on the type of monitor, it may remain in your uterus or on your baby's head until birth. ? Your health care provider will discuss the benefits and risks of internal monitoring with you and will ask for your permission before inserting the monitors.  Telemetry. This is a type of continuous monitoring that can be done with external or internal monitors. Instead of having to stay in bed, you are able to move around during telemetry. Ask your health care provider if telemetry is an option for you.  Physical exam Your health care provider may perform a physical exam. This may include:  Checking whether your baby is positioned: ? With the head toward your vagina (head-down). This is most common. ? With the head toward the top of your uterus (head-up or breech). If your baby is in a breech position, your health care provider may try to turn your baby to a head-down position so you can deliver vaginally. If it does not seem that your baby can be born vaginally, your provider may recommend surgery to deliver your baby. In rare cases, you may be able to deliver vaginally if your baby is head-up (breech delivery). ? Lying sideways (transverse). Babies that are lying sideways cannot be delivered vaginally.  Checking your cervix to determine: ? Whether it is thinning out (effacing). ? Whether it is opening up (dilating). ? How low your baby has moved into your birth canal.  What are the three stages of labor and delivery?  Normal labor and delivery is divided into the following three stages: Stage 1  Stage 1 is the longest stage of labor,  and it can last for hours or days. Stage 1 includes: ? Early labor. This is when contractions may be irregular, or regular and mild. Generally, early labor contractions are more than 10 minutes apart. ? Active labor. This is when contractions get longer, more regular, more frequent, and more intense. ? The transition phase. This is when contractions happen very close together, are very intense, and may last longer than during any other part of labor.  Contractions generally feel mild, infrequent, and irregular at first. They get stronger, more frequent (about every 2-3 minutes), and more regular as you progress from early labor through active labor and transition.  Many women progress through stage 1 naturally, but you may need help to continue making progress. If this happens, your health care provider may talk with you about: ? Rupturing your amniotic sac if it has not ruptured yet. ? Giving you medicine to help make your contractions stronger and more frequent.  Stage 1 ends when your cervix is completely dilated to 4 inches (10 cm) and completely effaced. This happens at the end of the transition phase. Stage 2  Once your cervix   is completely effaced and dilated to 4 inches (10 cm), you may start to feel an urge to push. It is common for the body to naturally take a rest before feeling the urge to push, especially if you received an epidural or certain other pain medicines. This rest period may last for up to 1-2 hours, depending on your unique labor experience.  During stage 2, contractions are generally less painful, because pushing helps relieve contraction pain. Instead of contraction pain, you may feel stretching and burning pain, especially when the widest part of your baby's head passes through the vaginal opening (crowning).  Your health care provider will closely monitor your pushing progress and your baby's progress through the vagina during stage 2.  Your health care provider may  massage the area of skin between your vaginal opening and anus (perineum) or apply warm compresses to your perineum. This helps it stretch as the baby's head starts to crown, which can help prevent perineal tearing. ? In some cases, an incision may be made in your perineum (episiotomy) to allow the baby to pass through the vaginal opening. An episiotomy helps to make the opening of the vagina larger to allow more room for the baby to fit through.  It is very important to breathe and focus so your health care provider can control the delivery of your baby's head. Your health care provider may have you decrease the intensity of your pushing, to help prevent perineal tearing.  After delivery of your baby's head, the shoulders and the rest of the body generally deliver very quickly and without difficulty.  Once your baby is delivered, the umbilical cord may be cut right away, or this may be delayed for 1-2 minutes, depending on your baby's health. This may vary among health care providers, hospitals, and birth centers.  If you and your baby are healthy enough, your baby may be placed on your chest or abdomen to help maintain the baby's temperature and to help you bond with each other. Some mothers and babies start breastfeeding at this time. Your health care team will dry your baby and help keep your baby warm during this time.  Your baby may need immediate care if he or she: ? Showed signs of distress during labor. ? Has a medical condition. ? Was born too early (prematurely). ? Had a bowel movement before birth (meconium). ? Shows signs of difficulty transitioning from being inside the uterus to being outside of the uterus. If you are planning to breastfeed, your health care team will help you begin a feeding. Stage 3  The third stage of labor starts immediately after the birth of your baby and ends after you deliver the placenta. The placenta is an organ that develops during pregnancy to provide  oxygen and nutrients to your baby in the womb.  Delivering the placenta may require some pushing, and you may have mild contractions. Breastfeeding can stimulate contractions to help you deliver the placenta.  After the placenta is delivered, your uterus should tighten (contract) and become firm. This helps to stop bleeding in your uterus. To help your uterus contract and to control bleeding, your health care provider may: ? Give you medicine by injection, through an IV tube, by mouth, or through your rectum (rectally). ? Massage your abdomen or perform a vaginal exam to remove any blood clots that are left in your uterus. ? Empty your bladder by placing a thin, flexible tube (catheter) into your bladder. ? Encourage you to   breastfeed your baby. After labor is over, you and your baby will be monitored closely to ensure that you are both healthy until you are ready to go home. Your health care team will teach you how to care for yourself and your baby. This information is not intended to replace advice given to you by your health care provider. Make sure you discuss any questions you have with your health care provider. Document Released: 01/29/2008 Document Revised: 11/09/2015 Document Reviewed: 05/06/2015 Elsevier Interactive Patient Education  2018 [employer]  

## 2018-01-11 ENCOUNTER — Ambulatory Visit (INDEPENDENT_AMBULATORY_CARE_PROVIDER_SITE_OTHER): Payer: Self-pay | Admitting: Advanced Practice Midwife

## 2018-01-11 ENCOUNTER — Inpatient Hospital Stay (HOSPITAL_COMMUNITY): Payer: Medicaid Other | Admitting: Anesthesiology

## 2018-01-11 ENCOUNTER — Encounter (HOSPITAL_COMMUNITY): Payer: Self-pay | Admitting: Advanced Practice Midwife

## 2018-01-11 ENCOUNTER — Inpatient Hospital Stay (HOSPITAL_COMMUNITY)
Admission: AD | Admit: 2018-01-11 | Discharge: 2018-01-14 | DRG: 768 | Disposition: A | Discharge status: Home / Self Care | Payer: Medicaid Other | Attending: Family Medicine | Admitting: Family Medicine

## 2018-01-11 ENCOUNTER — Encounter: Payer: Self-pay | Admitting: Advanced Practice Midwife

## 2018-01-11 VITALS — BP 164/111 | HR 75 | Wt 158.5 lb

## 2018-01-11 DIAGNOSIS — D649 Anemia, unspecified: Secondary | ICD-10-CM | POA: Diagnosis present

## 2018-01-11 DIAGNOSIS — O1490 Unspecified pre-eclampsia, unspecified trimester: Secondary | ICD-10-CM | POA: Diagnosis present

## 2018-01-11 DIAGNOSIS — O1414 Severe pre-eclampsia complicating childbirth: Principal | ICD-10-CM | POA: Diagnosis present

## 2018-01-11 DIAGNOSIS — Z3A39 39 weeks gestation of pregnancy: Secondary | ICD-10-CM

## 2018-01-11 DIAGNOSIS — O9902 Anemia complicating childbirth: Secondary | ICD-10-CM | POA: Diagnosis present

## 2018-01-11 DIAGNOSIS — Z3400 Encounter for supervision of normal first pregnancy, unspecified trimester: Secondary | ICD-10-CM

## 2018-01-11 DIAGNOSIS — Z3490 Encounter for supervision of normal pregnancy, unspecified, unspecified trimester: Secondary | ICD-10-CM

## 2018-01-11 DIAGNOSIS — O1494 Unspecified pre-eclampsia, complicating childbirth: Secondary | ICD-10-CM | POA: Diagnosis not present

## 2018-01-11 DIAGNOSIS — Z23 Encounter for immunization: Secondary | ICD-10-CM

## 2018-01-11 DIAGNOSIS — O1493 Unspecified pre-eclampsia, third trimester: Secondary | ICD-10-CM

## 2018-01-11 LAB — COMPREHENSIVE METABOLIC PANEL
ALK PHOS: 271 U/L — AB (ref 38–126)
ALT: 14 U/L (ref 0–44)
ALT: 13 U/L (ref 0–44)
ANION GAP: 10 (ref 5–15)
ANION GAP: 13 (ref 5–15)
AST: 26 U/L (ref 15–41)
AST: 26 U/L (ref 15–41)
Albumin: 2.8 g/dL — ABNORMAL LOW (ref 3.5–5.0)
Albumin: 3 g/dL — ABNORMAL LOW (ref 3.5–5.0)
Alkaline Phosphatase: 278 U/L — ABNORMAL HIGH (ref 38–126)
BILIRUBIN TOTAL: 0.4 mg/dL (ref 0.3–1.2)
BILIRUBIN TOTAL: 1 mg/dL (ref 0.3–1.2)
BUN: 8 mg/dL (ref 6–20)
BUN: 7 mg/dL (ref 6–20)
CALCIUM: 8.6 mg/dL — AB (ref 8.9–10.3)
CO2: 21 mmol/L — ABNORMAL LOW (ref 22–32)
CO2: 18 mmol/L — ABNORMAL LOW (ref 22–32)
CREATININE: 0.56 mg/dL (ref 0.44–1.00)
Calcium: 9.1 mg/dL (ref 8.9–10.3)
Chloride: 104 mmol/L (ref 98–111)
Chloride: 104 mmol/L (ref 98–111)
Creatinine, Ser: 0.56 mg/dL (ref 0.44–1.00)
GFR calc Af Amer: >60 mL/min (ref >60)
GFR calc non Af Amer: >60 mL/min (ref >60)
Glucose, Bld: 87 mg/dL (ref 70–99)
Glucose, Bld: 86 mg/dL (ref 70–99)
POTASSIUM: 4 mmol/L (ref 3.5–5.1)
Potassium: 3.6 mmol/L (ref 3.5–5.1)
Sodium: 135 mmol/L (ref 135–145)
Sodium: 135 mmol/L (ref 135–145)
TOTAL PROTEIN: 7.2 g/dL (ref 6.5–8.1)
Total Protein: 6.3 g/dL — ABNORMAL LOW (ref 6.5–8.1)

## 2018-01-11 LAB — CBC
HCT: 28.9 % — ABNORMAL LOW (ref 36.0–46.0)
HEMATOCRIT: 29.6 % — AB (ref 36.0–46.0)
HEMOGLOBIN: 9.2 g/dL — AB (ref 12.0–15.0)
Hemoglobin: 9.4 g/dL — ABNORMAL LOW (ref 12.0–15.0)
MCH: 24.3 pg — ABNORMAL LOW (ref 26.0–34.0)
MCH: 24.4 pg — AB (ref 26.0–34.0)
MCHC: 31.8 g/dL (ref 30.0–36.0)
MCHC: 31.8 g/dL (ref 30.0–36.0)
MCV: 76.5 fL — AB (ref 78.0–100.0)
MCV: 76.7 fL — AB (ref 78.0–100.0)
Platelets: 215 10*3/uL (ref 150–400)
Platelets: 202 10*3/uL (ref 150–400)
RBC: 3.87 MIL/uL (ref 3.87–5.11)
RBC: 3.77 MIL/uL — ABNORMAL LOW (ref 3.87–5.11)
RDW: 17.4 % — ABNORMAL HIGH (ref 11.5–15.5)
RDW: 17.6 % — ABNORMAL HIGH (ref 11.5–15.5)
WBC: 7.9 10*3/uL (ref 4.0–10.5)
WBC: 10.1 10*3/uL (ref 4.0–10.5)

## 2018-01-11 LAB — PROTIME-INR
INR: 0.9
Prothrombin Time: 12 seconds (ref 11.4–15.2)

## 2018-01-11 LAB — MAGNESIUM: Magnesium: 6.8 mg/dL (ref 1.7–2.4)

## 2018-01-11 LAB — SEDIMENTATION RATE: Sed Rate: 61 mm/hr — ABNORMAL HIGH (ref 0–22)

## 2018-01-11 LAB — PROTEIN / CREATININE RATIO, URINE
Creatinine, Urine: 123 mg/dL
Protein Creatinine Ratio: 1.5 mg/mg{Cre} — ABNORMAL HIGH (ref 0.00–0.15)
Total Protein, Urine: 185 mg/dL

## 2018-01-11 LAB — TYPE AND SCREEN
ABO/RH(D): A POS
ANTIBODY SCREEN: NEGATIVE

## 2018-01-11 LAB — TSH: TSH: 2.415 u[IU]/mL (ref 0.350–4.500)

## 2018-01-11 LAB — CK: Total CK: 44 U/L (ref 38–234)

## 2018-01-11 LAB — ABO/RH: ABO/RH(D): A POS

## 2018-01-11 LAB — APTT: aPTT: 28 seconds (ref 24–36)

## 2018-01-11 MED ORDER — OXYTOCIN BOLUS FROM INFUSION
500.0000 mL | Freq: Once | INTRAVENOUS | Status: AC
  Administered 2018-01-12: 500 mL via INTRAVENOUS

## 2018-01-11 MED ORDER — OXYCODONE-ACETAMINOPHEN 5-325 MG PO TABS: 2.0000 | ORAL_TABLET | ORAL | Status: DC | PRN

## 2018-01-11 MED ORDER — EPHEDRINE 5 MG/ML INJ
10.0000 mg | INTRAVENOUS | Status: DC | PRN
  Filled 2018-01-11: qty 2

## 2018-01-11 MED ORDER — LABETALOL HCL 5 MG/ML IV SOLN: 80.0000 mg | INTRAVENOUS | Status: DC | PRN

## 2018-01-11 MED ORDER — OXYCODONE-ACETAMINOPHEN 5-325 MG PO TABS: 1.0000 | ORAL_TABLET | ORAL | Status: DC | PRN

## 2018-01-11 MED ORDER — DIPHENHYDRAMINE HCL 50 MG/ML IJ SOLN: 12.5000 mg | INTRAMUSCULAR | Status: DC | PRN

## 2018-01-11 MED ORDER — MAGNESIUM SULFATE BOLUS VIA INFUSION
4.0000 g | Freq: Once | INTRAVENOUS | Status: AC
  Administered 2018-01-11: 4 g via INTRAVENOUS
  Filled 2018-01-11: qty 500

## 2018-01-11 MED ORDER — TERBUTALINE SULFATE 1 MG/ML IJ SOLN: 0.2500 mg | Freq: Once | INTRAMUSCULAR | Status: DC | PRN

## 2018-01-11 MED ORDER — LACTATED RINGERS IV SOLN: INTRAVENOUS | Status: DC

## 2018-01-11 MED ORDER — OXYTOCIN BOLUS FROM INFUSION: 500.0000 mL | Freq: Once | INTRAVENOUS | Status: DC

## 2018-01-11 MED ORDER — ONDANSETRON HCL 4 MG/2ML IJ SOLN: 4.0000 mg | Freq: Four times a day (QID) | INTRAMUSCULAR | Status: DC | PRN

## 2018-01-11 MED ORDER — FENTANYL 2.5 MCG/ML BUPIVACAINE 1/10 % EPIDURAL INFUSION (WH - ANES)
14.0000 mL/h | INTRAMUSCULAR | Status: DC | PRN
  Administered 2018-01-11 – 2018-01-12 (×2): 14 mL/h via EPIDURAL
  Filled 2018-01-11 (×2): qty 100

## 2018-01-11 MED ORDER — ACETAMINOPHEN 325 MG PO TABS: 650.0000 mg | ORAL_TABLET | ORAL | Status: DC | PRN

## 2018-01-11 MED ORDER — MAGNESIUM SULFATE 40 G IN LACTATED RINGERS - SIMPLE
2.0000 g/h | INTRAVENOUS | Status: DC
  Filled 2018-01-11: qty 500

## 2018-01-11 MED ORDER — LABETALOL HCL 5 MG/ML IV SOLN: 40.0000 mg | INTRAVENOUS | Status: DC | PRN

## 2018-01-11 MED ORDER — OXYTOCIN 40 UNITS IN LACTATED RINGERS INFUSION - SIMPLE MED
1.0000 m[IU]/min | INTRAVENOUS | Status: DC
  Administered 2018-01-11: 2 m[IU]/min via INTRAVENOUS

## 2018-01-11 MED ORDER — OXYTOCIN 40 UNITS IN LACTATED RINGERS INFUSION - SIMPLE MED: 2.5000 [IU]/h | INTRAVENOUS | Status: DC

## 2018-01-11 MED ORDER — SOD CITRATE-CITRIC ACID 500-334 MG/5ML PO SOLN: 30.0000 mL | ORAL | Status: DC | PRN

## 2018-01-11 MED ORDER — FENTANYL CITRATE (PF) 100 MCG/2ML IJ SOLN
100.0000 ug | INTRAMUSCULAR | Status: DC | PRN
  Administered 2018-01-11 – 2018-01-12 (×3): 100 ug via INTRAVENOUS
  Filled 2018-01-11 (×2): qty 2

## 2018-01-11 MED ORDER — LACTATED RINGERS IV SOLN: 500.0000 mL | INTRAVENOUS | Status: DC | PRN

## 2018-01-11 MED ORDER — LABETALOL HCL 5 MG/ML IV SOLN
20.0000 mg | INTRAVENOUS | Status: DC | PRN
  Administered 2018-01-11: 20 mg via INTRAVENOUS
  Filled 2018-01-11: qty 4

## 2018-01-11 MED ORDER — PHENYLEPHRINE 40 MCG/ML (10ML) SYRINGE FOR IV PUSH (FOR BLOOD PRESSURE SUPPORT)
80.0000 ug | PREFILLED_SYRINGE | INTRAVENOUS | Status: DC | PRN
  Filled 2018-01-11: qty 5

## 2018-01-11 MED ORDER — MAGNESIUM SULFATE 40 G IN LACTATED RINGERS - SIMPLE
1.0000 g/h | INTRAVENOUS | Status: DC
  Administered 2018-01-11: 1 g/h via INTRAVENOUS
  Filled 2018-01-11 (×2): qty 500

## 2018-01-11 MED ORDER — LACTATED RINGERS IV SOLN
INTRAVENOUS | Status: DC
  Administered 2018-01-11: 13:00:00 via INTRAVENOUS

## 2018-01-11 MED ORDER — OXYTOCIN 40 UNITS IN LACTATED RINGERS INFUSION - SIMPLE MED
2.5000 [IU]/h | INTRAVENOUS | Status: DC
  Administered 2018-01-12: 2.5 [IU]/h via INTRAVENOUS
  Filled 2018-01-11: qty 1000

## 2018-01-11 MED ORDER — LIDOCAINE HCL (PF) 1 % IJ SOLN
30.0000 mL | INTRAMUSCULAR | Status: AC | PRN
  Administered 2018-01-12: 30 mL via SUBCUTANEOUS
  Filled 2018-01-11: qty 30

## 2018-01-11 MED ORDER — LACTATED RINGERS IV SOLN: 500.0000 mL | Freq: Once | INTRAVENOUS | Status: DC

## 2018-01-11 MED ORDER — HYDRALAZINE HCL 20 MG/ML IJ SOLN: 10.0000 mg | INTRAMUSCULAR | Status: DC | PRN

## 2018-01-11 MED ORDER — PHENYLEPHRINE 40 MCG/ML (10ML) SYRINGE FOR IV PUSH (FOR BLOOD PRESSURE SUPPORT)
80.0000 ug | PREFILLED_SYRINGE | INTRAVENOUS | Status: DC | PRN
  Filled 2018-01-11: qty 10
  Filled 2018-01-11: qty 5

## 2018-01-11 MED ORDER — MISOPROSTOL 50MCG HALF TABLET
50.0000 ug | ORAL_TABLET | ORAL | Status: DC
  Administered 2018-01-11 (×2): 50 ug via ORAL
  Filled 2018-01-11 (×3): qty 1

## 2018-01-11 MED ORDER — LIDOCAINE HCL (PF) 1 % IJ SOLN: 30.0000 mL | INTRAMUSCULAR | Status: DC | PRN

## 2018-01-11 MED ORDER — FENTANYL CITRATE (PF) 100 MCG/2ML IJ SOLN
INTRAMUSCULAR | Status: AC
  Filled 2018-01-11: qty 2

## 2018-01-11 MED ORDER — FLEET ENEMA 7-19 GM/118ML RE ENEM: 1.0000 | ENEMA | RECTAL | Status: DC | PRN

## 2018-01-11 NOTE — Anesthesia Preprocedure Evaluation (Addendum)
Anesthesia Evaluation  Patient identified by MRN, date of birth, ID band Patient awake    Reviewed: Allergy & Precautions, NPO status , Patient's Chart, lab work & pertinent test results  Airway Mallampati: I  TM Distance: >3 FB Neck ROM: Full    Dental no notable dental hx.    Pulmonary    Pulmonary exam normal breath sounds clear to auscultation       Cardiovascular Normal cardiovascular exam Rhythm:Regular Rate:Normal     Neuro/Psych negative neurological ROS  negative psych ROS   GI/Hepatic negative GI ROS, Neg liver ROS,   Endo/Other  negative endocrine ROS  Renal/GU negative Renal ROS  negative genitourinary   Musculoskeletal negative musculoskeletal ROS (+)   Abdominal   Peds  Hematology negative hematology ROS (+)   Anesthesia Other Findings IOL for PreE  Reproductive/Obstetrics (+) Pregnancy                            Anesthesia Physical Anesthesia Plan  ASA: II  Anesthesia Plan: Epidural   Post-op Pain Management:    Induction:   PONV Risk Score and Plan: Treatment may vary due to age or medical condition  Airway Management Planned: Natural Airway  Additional Equipment:   Intra-op Plan:   Post-operative Plan:   Informed Consent: I have reviewed the patients History and Physical, chart, labs and discussed the procedure including the risks, benefits and alternatives for the proposed anesthesia with the patient or authorized representative who has indicated his/her understanding and acceptance.     Plan Discussed with: Anesthesiologist  Anesthesia Plan Comments: (Patient identified. Risks, benefits, options discussed with patient including but not limited to bleeding, infection, nerve damage, paralysis, failed block, incomplete pain control, headache, blood pressure changes, nausea, vomiting, reactions to medication, itching, and post partum back pain. Confirmed with  bedside nurse the patient's most recent platelet count. Confirmed with the patient that they are not taking any anticoagulation, have any bleeding history or any family history of bleeding disorders. Patient expressed understanding and wishes to proceed. All questions were answered. )        Anesthesia Quick Evaluation

## 2018-01-11 NOTE — H&P (Signed)
Amanda Bright is a 22 y.o. female presenting for induction of labor for new-onset of preeclampsia  Has been followed in the [facility] clinic and had some edema last week but was not hypertensive until today.  Patient Active Problem List   Diagnosis Date Noted  . Preeclampsia 01/11/2018  . Pregnancy 01/11/2018  . Supervision of normal first pregnancy, antepartum 09/07/2017   . OB History    Gravida  1   Para  0   Term  0   Preterm  0   AB  0   Living  0     SAB  0   TAB  0   Ectopic  0   Multiple  0   Live Births  0          Past Medical History:  Diagnosis Date  . Medical history non-contributory    Past Surgical History:  Procedure Laterality Date  . APPENDECTOMY     3rd grade   Family History: family history is not on file. Social History:  reports that she has never smoked. She has never used smokeless tobacco. She reports that she drinks alcohol. She reports that she does not use drugs.     Maternal Diabetes: No Genetic Screening: Declined Maternal Ultrasounds/Referrals: Normal Fetal Ultrasounds or other Referrals:  None Maternal Substance Abuse:  No Significant Maternal Medications:  None Significant Maternal Lab Results:  Lab values include: Group B Strep negative Other Comments:  None  Review of Systems  Constitutional: Negative for chills and fever.  Respiratory: Negative for shortness of breath.   Cardiovascular: Positive for leg swelling (trace).  Gastrointestinal: Negative for abdominal pain, constipation, diarrhea, nausea and vomiting.  Genitourinary: Negative for dysuria.  Musculoskeletal: Negative for back pain.  Neurological: Negative for dizziness, focal weakness and headaches.   Maternal Medical History:  Reason for admission: Nausea. Hypertension   Contractions: Frequency: regular.   Perceived severity is mild.    Fetal activity: Perceived fetal activity is normal.   Last perceived fetal movement was within the past hour.     Prenatal complications: PIH and pre-eclampsia.   No bleeding, oligohydramnios, polyhydramnios, preterm labor or substance abuse.   Prenatal Complications - Diabetes: none.      There were no vitals taken for this visit. Maternal Exam:  Uterine Assessment: Contraction strength is mild.  Contraction frequency is regular.   Abdomen: Patient reports no abdominal tenderness. Estimated fetal weight is 6.5.   Fetal presentation: vertex  Introitus: Normal vulva. Vagina is positive for vaginal discharge.  Ferning test: not done.  Nitrazine test: not done.  Pelvis: adequate for delivery.   Cervix: Cervix evaluated by digital exam.     Fetal Exam Fetal Monitor Review: Mode: ultrasound.   Baseline rate: 140.  Variability: moderate (6-25 bpm).   Pattern: accelerations present and no decelerations.    Fetal State Assessment: Category I - tracings are normal.     Physical Exam  Constitutional: She is oriented to person, place, and time. She appears well-developed and well-nourished. No distress.  HENT:  Head: Normocephalic.  Neck: Normal range of motion.  Cardiovascular: Normal rate, regular rhythm and normal heart sounds. Exam reveals no gallop and no friction rub.  No murmur heard. Respiratory: Effort normal and breath sounds normal. No respiratory distress. She has no wheezes. She has no rales.  GI: Soft. She exhibits no distension. There is no tenderness. There is no rebound and no guarding.  Genitourinary: Vaginal discharge found.  Genitourinary Comments: Dilation: 1 Effacement (%):  60 Presentation: Vertex Exam by:: Hilda Lias, CNM   Musculoskeletal: Normal range of motion.  Neurological: She is alert and oriented to person, place, and time.  Skin: Skin is warm and dry.  Psychiatric: She has a normal mood and affect.    Prenatal labs: ABO, Rh: A/Positive/-- (05/06 0950) Antibody: Negative (05/06 0950) Rubella: 1.72 (05/06 0950) RPR: Non Reactive (06/03 0910)  HBsAg:  Negative (05/06 0950)  HIV: Non Reactive (06/03 0910)  GBS:     Assessment/Plan: Single intrauterine pregnancy at [redacted]w[redacted]d Preeclampsia  Admit to [facility] Routine orders IOL with cytotec Attempted foley unsuccessfullly    Wynelle Bourgeois 01/11/2018, 12:54 PM

## 2018-01-11 NOTE — Progress Notes (Signed)
   PRENATAL VISIT NOTE  Subjective:  Amanda Bright is a 22 y.o. G1P0000 at [redacted]w[redacted]d being seen today for ongoing prenatal care.  She is currently monitored for the following issues for this low-risk pregnancy and has Supervision of normal first pregnancy, antepartum on their problem list.  Patient reports no complaints.  Contractions: Irregular. Vag. Bleeding: None.  Movement: Present. Denies leaking of fluid.    Denies HA, visual disturbance or RUQ pain    The following portions of the patient's history were reviewed and updated as appropriate: allergies, current medications, past family history, past medical history, past social history, past surgical history and problem list. Problem list updated.  Objective:   Vitals:   01/11/18 1119 01/11/18 1121  BP: (!) 152/93 (!) 164/111  Pulse: 69 75  Weight: 158 lb 8 oz (71.9 kg)     Fetal Status: Fetal Heart Rate (bpm): 152 Fundal Height: 38 cm Movement: Present     General:  Alert, oriented and cooperative. Patient is in no acute distress.  Skin: Skin is warm and dry. No rash noted.   Cardiovascular: Normal heart rate noted  Respiratory: Normal respiratory effort, no problems with respiration noted  Abdomen: Soft, gravid, appropriate for gestational age.  Pain/Pressure: Present     Pelvic: Cervical exam deferred        Extremities: Normal range of motion.  Edema: Moderate pitting, indentation subsides rapidly  Mental Status: Normal mood and affect. Normal behavior. Normal judgment and thought content.   +clonus, hyperreflexic    Pt informed that the ultrasound is considered a limited OB ultrasound and is not intended to be a complete ultrasound exam.  Patient also informed that the ultrasound is not being completed with the intent of assessing for fetal or placental anomalies or any pelvic abnormalities.  Explained that the purpose of today's ultrasound is to assess for  presentation.  Patient acknowledges the purpose of the exam and  the limitations of the study.     Assessment and Plan:  Pregnancy: G1P0000 at [redacted]w[redacted]d  1. Supervision of normal first pregnancy, antepartum  2. Pre-eclampsia in third trimester - Consult with Dr. Macon Large, patient to go to labor and delivery   Term labor symptoms and general obstetric precautions including but not limited to vaginal bleeding, contractions, leaking of fluid and fetal movement were reviewed in detail with the patient. Please refer to After Visit Summary for other counseling recommendations.  Return in about 4 weeks (around 02/08/2018) for PP visit .  Future Appointments  Date Time Provider Department Center  01/18/2018  8:15 AM [facility]    Thressa Sheller, CNM

## 2018-01-11 NOTE — Anesthesia Pain Management Evaluation Note (Signed)
  CRNA Pain Management Visit Note  Patient: Amanda Bright, 22 y.o., female  "Hello I am a member of the anesthesia team at [facility]. We have an anesthesia team available at all times to provide care throughout the hospital, including epidural management and anesthesia for C-section. I don't know your plan for the delivery whether it a natural birth, water birth, IV sedation, nitrous supplementation, doula or epidural, but we want to meet your pain goals."   1.Was your pain managed to your expectations on prior hospitalizations?   No prior hospitalizations  2.What is your expectation for pain management during this hospitalization?     Epidural  3.How can we help you reach that goal? Epidural at pain goal.  Record the patient's initial score and the patient's pain goal.   Pain: 4  Pain Goal: 8 [facility] wants you to be able to say your pain was always managed very well.  Amanda Bright 01/11/2018

## 2018-01-11 NOTE — Progress Notes (Signed)
Patient ID: Amanda Bright, female   DOB: [birth year], 22 y.o.   MRN: [id number] Late note for 1520hrs  Notified by RN that patient was getting up to go to bathroom and could not bear weight  Magnesium sulfate bolus had just been completed at 1510hrs and 2gm/hr had just been started  DTRs are absent Clonus is absent.  On my prior exam (at H&P), DTRs were brisk and there was 1 beat of clonus  HR RRR Vitals:   01/11/18 1515 01/11/18 1532 01/11/18 1537 01/11/18 1600  BP:  (!) 155/98 (!) 146/92 (!) 142/85  Pulse:  97 97 88  Resp:  18  18  Temp:      TempSrc:      SpO2: 98%     Weight:      Height:       No dyspnea or respiratory depression Awake and alert  Magnesium Sulfate drip stopped Foley placed. Labs ordered to check Creatinine and Mag level  Results for orders placed or performed during the hospital encounter of 01/11/18 (from the past 24 hour(s))  Protein / creatinine ratio, urine     Status: Abnormal   Collection Time: 01/11/18  1:00 PM  Result Value Ref Range   Creatinine, Urine 123.00 mg/dL   Total Protein, Urine 185 mg/dL   Protein Creatinine Ratio 1.50 (H) 0.00 - 0.15 mg/mg[Cre]  CBC     Status: Abnormal   Collection Time: 01/11/18  1:03 PM  Result Value Ref Range   WBC 7.9 4.0 - 10.5 K/uL   RBC 3.87 3.87 - 5.11 MIL/uL   Hemoglobin 9.4 (L) 12.0 - 15.0 g/dL   HCT 01.0 (L) 93.2 - 35.5 %   MCV 76.5 (L) 78.0 - 100.0 fL   MCH 24.3 (L) 26.0 - 34.0 pg   MCHC 31.8 30.0 - 36.0 g/dL   RDW 73.2 (H) 20.2 - 54.2 %   Platelets 215 150 - 400 K/uL  Comprehensive metabolic panel     Status: Abnormal   Collection Time: 01/11/18  1:03 PM  Result Value Ref Range   Sodium 135 135 - 145 mmol/L   Potassium 4.0 3.5 - 5.1 mmol/L   Chloride 104 98 - 111 mmol/L   CO2 21 (L) 22 - 32 mmol/L   Glucose, Bld 87 70 - 99 mg/dL   BUN 8 6 - 20 mg/dL   Creatinine, Ser 7.06 0.44 - 1.00 mg/dL   Calcium 9.1 8.9 - 23.7 mg/dL   Total Protein 6.3 (L) 6.5 - 8.1 g/dL   Albumin 2.8 (L) 3.5 -  5.0 g/dL   AST 26 15 - 41 U/L   ALT 14 0 - 44 U/L   Alkaline Phosphatase 278 (H) 38 - 126 U/L   Total Bilirubin 0.4 0.3 - 1.2 mg/dL   GFR calc non Af Amer >60 >60 mL/min   GFR calc Af Amer >60 >60 mL/min   Anion gap 10 5 - 15  Type and screen [facility]     Status: None   Collection Time: 01/11/18  1:03 PM  Result Value Ref Range   ABO/RH(D) A POS    Antibody Screen NEG    Sample Expiration      01/14/2018 Performed at [facility], [address], [place], [place] [address]   Magnesium     Status: Abnormal   Collection Time: 01/11/18  3:31 PM  Result Value Ref Range   Magnesium 6.8 (HH) 1.7 - 2.4 mg/dL  Comprehensive metabolic panel  Status: Abnormal   Collection Time: 01/11/18  3:31 PM  Result Value Ref Range   Sodium 135 135 - 145 mmol/L   Potassium 3.6 3.5 - 5.1 mmol/L   Chloride 104 98 - 111 mmol/L   CO2 18 (L) 22 - 32 mmol/L   Glucose, Bld 86 70 - 99 mg/dL   BUN 7 6 - 20 mg/dL   Creatinine, Ser 1.61 0.44 - 1.00 mg/dL   Calcium 8.6 (L) 8.9 - 10.3 mg/dL   Total Protein 7.2 6.5 - 8.1 g/dL   Albumin 3.0 (L) 3.5 - 5.0 g/dL   AST 26 15 - 41 U/L   ALT 13 0 - 44 U/L   Alkaline Phosphatase 271 (H) 38 - 126 U/L   Total Bilirubin 1.0 0.3 - 1.2 mg/dL   GFR calc non Af Amer >60 >60 mL/min   GFR calc Af Amer >60 >60 mL/min   Anion gap 13 5 - 15   Dr Debroah Loop updated  Given sudden onset. I ordered the Myasthenia Gravis order set in part Will get labs and and EKG Will keep Magnesium sulfate off for now

## 2018-01-11 NOTE — Progress Notes (Signed)
Patient ID: Amanda Bright, female   DOB: [birth year], 22 y.o.   MRN: [id number] Doing well. Requesting IV pain medicine   Vitals:   01/11/18 1836 01/11/18 1841 01/11/18 1900 01/11/18 1916  BP:   (!) 156/98   Pulse:   90   Resp:   16   Temp:      TempSrc:      SpO2: 98% 99% 99% 97%  Weight:      Height:       FHR reassuring UCs becoming more painful  Dilation: 1 Effacement (%): 60 Presentation: Vertex Exam by:: lee  Will continue to observe

## 2018-01-11 NOTE — Progress Notes (Signed)
LABOR PROGRESS NOTE  Amanda Bright is a 22 y.o. G1P0000 at [redacted]w[redacted]d  admitted for IOL for Pre-Eclampsia.   Subjective: Strip check.   Objective: BP (!) 152/91   Pulse 87   Temp 98.5 F (36.9 C) (Oral)   Resp 20   Ht 5\' 4"  (1.626 m)   Wt 72.3 kg   SpO2 97%   BMI 27.38 kg/m  or  Vitals:   01/11/18 1900 01/11/18 1916 01/11/18 1931 01/11/18 2001  BP: (!) 156/98  (!) 141/96 (!) 152/91  Pulse: 90  93 87  Resp: 16  18 20   Temp:      TempSrc:      SpO2: 99% 97% 97% 97%  Weight:      Height:        Dilation: 3 Effacement (%): 50 Presentation: Vertex Exam by:: E Rothermel RN FHT: baseline rate 140, moderate varibility, +acel, no decel Toco: q2-4 min   Labs: Lab Results  Component Value Date   WBC 7.9 01/11/2018   HGB 9.4 (L) 01/11/2018   HCT 29.6 (L) 01/11/2018   MCV 76.5 (L) 01/11/2018   PLT 215 01/11/2018    Patient Active Problem List   Diagnosis Date Noted  . Preeclampsia 01/11/2018  . Pregnancy 01/11/2018  . Supervision of normal first pregnancy, antepartum 09/07/2017    Assessment / Plan: 22 y.o. G1P0000 at [redacted]w[redacted]d here for IOL for Pre-E.   Pre-E: Patient on Mag Sulfate at 1 gm/hr. Monitor closely. BPs remain elevated, no recent severe range pressures.  Labor: Latent labor. Start Pitocin.  Fetal Wellbeing:  Cat I  Pain Control:  Epidural upon request  Anticipated MOD:  NSVD   Marcy Siren, D.O. OB Fellow  01/11/2018, 10:07 PM

## 2018-01-11 NOTE — Progress Notes (Signed)
Patient ID: Amanda Bright, female   DOB: 08/20/1995, 22 y.o.   MRN: [id number] Dr Debroah Loop came by to speak with patient  He recommends restarting Magnesium Sulfate at 1gm/hour She is hesitant to proceed due to prior events  He reassured her we will watch her closely and turn it off if she does not tolerate it  Priority is also to elevate seizure threshold, so in the setting of preeclampsia with severe features, this is the recommended avenue of treatment  Vitals:   01/11/18 1746 01/11/18 1747 01/11/18 1749 01/11/18 1756  BP:  (!) 146/98    Pulse:  87    Resp:      Temp:      TempSrc:      SpO2: 99%  99% 99%  Weight:      Height:       Will also avoid Labetalol, just in case she does have MG Would use Apresoline if pressures become severe  FHR stable and UCs continue to be mild  Anesthesiologist update, states there are no contraindications to regional anesthesia  Cardiology reviewed EKG and states it is not concerning for infarct. Aviva Signs, CNM

## 2018-01-12 ENCOUNTER — Encounter (HOSPITAL_COMMUNITY): Payer: Self-pay

## 2018-01-12 ENCOUNTER — Encounter (HOSPITAL_COMMUNITY): Payer: Self-pay | Admitting: Certified Registered Nurse Anesthetist

## 2018-01-12 ENCOUNTER — Other Ambulatory Visit: Payer: Self-pay

## 2018-01-12 LAB — COMPREHENSIVE METABOLIC PANEL
ALBUMIN: 2.6 g/dL — AB (ref 3.5–5.0)
ALK PHOS: 250 U/L — AB (ref 38–126)
ALT: 13 U/L (ref 0–44)
AST: 25 U/L (ref 15–41)
Anion gap: 13 (ref 5–15)
BILIRUBIN TOTAL: 0.7 mg/dL (ref 0.3–1.2)
BUN: 6 mg/dL (ref 6–20)
CALCIUM: 8.2 mg/dL — AB (ref 8.9–10.3)
CO2: 18 mmol/L — AB (ref 22–32)
CREATININE: 0.61 mg/dL (ref 0.44–1.00)
Chloride: 103 mmol/L (ref 98–111)
GFR calc Af Amer: >60 mL/min (ref >60)
GFR calc non Af Amer: >60 mL/min (ref >60)
GLUCOSE: 90 mg/dL (ref 70–99)
Potassium: 3.8 mmol/L (ref 3.5–5.1)
SODIUM: 134 mmol/L — AB (ref 135–145)
TOTAL PROTEIN: 6.2 g/dL — AB (ref 6.5–8.1)

## 2018-01-12 LAB — CBC
HCT: 28.6 % — ABNORMAL LOW (ref 36.0–46.0)
HEMATOCRIT: 29.2 % — AB (ref 36.0–46.0)
Hemoglobin: 8.9 g/dL — ABNORMAL LOW (ref 12.0–15.0)
Hemoglobin: 9.2 g/dL — ABNORMAL LOW (ref 12.0–15.0)
MCH: 24.1 pg — AB (ref 26.0–34.0)
MCH: 24.2 pg — ABNORMAL LOW (ref 26.0–34.0)
MCHC: 31.1 g/dL (ref 30.0–36.0)
MCHC: 31.5 g/dL (ref 30.0–36.0)
MCV: 77.3 fL — ABNORMAL LOW (ref 78.0–100.0)
MCV: 76.8 fL — ABNORMAL LOW (ref 78.0–100.0)
PLATELETS: 204 10*3/uL (ref 150–400)
Platelets: 220 10*3/uL (ref 150–400)
RBC: 3.7 MIL/uL — ABNORMAL LOW (ref 3.87–5.11)
RBC: 3.8 MIL/uL — ABNORMAL LOW (ref 3.87–5.11)
RDW: 18.4 % — AB (ref 11.5–15.5)
RDW: 17.7 % — AB (ref 11.5–15.5)
WBC: 9.4 10*3/uL (ref 4.0–10.5)
WBC: 13.6 10*3/uL — AB (ref 4.0–10.5)

## 2018-01-12 LAB — ACETYLCHOLINE RECEPTOR, BINDING

## 2018-01-12 LAB — CALCIUM, IONIZED: Calcium, Ionized, Serum: 4.8 mg/dL (ref 4.5–5.6)

## 2018-01-12 LAB — MAGNESIUM
MAGNESIUM: 3.8 mg/dL — AB (ref 1.7–2.4)
Magnesium: 3.8 mg/dL — ABNORMAL HIGH (ref 1.7–2.4)

## 2018-01-12 LAB — STRIATED MUSCLE ANTIBODY: Anti-striation Abs: NEGATIVE

## 2018-01-12 LAB — RPR: RPR: NONREACTIVE

## 2018-01-12 MED ORDER — METHYLERGONOVINE MALEATE 0.2 MG/ML IJ SOLN
0.2000 mg | Freq: Once | INTRAMUSCULAR | Status: AC
  Administered 2018-01-12: 0.2 mg via INTRAMUSCULAR

## 2018-01-12 MED ORDER — MISOPROSTOL 200 MCG PO TABS
ORAL_TABLET | ORAL | Status: AC
  Administered 2018-01-12: 1000 ug via RECTAL
  Filled 2018-01-12: qty 5

## 2018-01-12 MED ORDER — ZOLPIDEM TARTRATE 5 MG PO TABS: 5.0000 mg | ORAL_TABLET | Freq: Every evening | ORAL | Status: DC | PRN

## 2018-01-12 MED ORDER — DIBUCAINE 1 % RE OINT: 1.0000 "application " | TOPICAL_OINTMENT | RECTAL | Status: DC | PRN

## 2018-01-12 MED ORDER — ONDANSETRON HCL 4 MG PO TABS: 4.0000 mg | ORAL_TABLET | ORAL | Status: DC | PRN

## 2018-01-12 MED ORDER — ACETAMINOPHEN 325 MG PO TABS
650.0000 mg | ORAL_TABLET | ORAL | Status: DC | PRN
  Administered 2018-01-12: 650 mg via ORAL
  Filled 2018-01-12: qty 2

## 2018-01-12 MED ORDER — TRANEXAMIC ACID 1000 MG/10ML IV SOLN: 1000.0000 mg | INTRAVENOUS | Status: DC

## 2018-01-12 MED ORDER — ONDANSETRON HCL 4 MG/2ML IJ SOLN
4.0000 mg | INTRAMUSCULAR | Status: DC | PRN
  Administered 2018-01-12: 4 mg via INTRAVENOUS
  Filled 2018-01-12: qty 2

## 2018-01-12 MED ORDER — SODIUM BICARBONATE 8.4 % IV SOLN
INTRAVENOUS | Status: DC | PRN
  Administered 2018-01-11: 3 mL via EPIDURAL
  Administered 2018-01-11 (×2): 4 mL via EPIDURAL

## 2018-01-12 MED ORDER — DIPHENHYDRAMINE HCL 25 MG PO CAPS: 25.0000 mg | ORAL_CAPSULE | Freq: Four times a day (QID) | ORAL | Status: DC | PRN

## 2018-01-12 MED ORDER — POLYETHYLENE GLYCOL 3350 17 G PO PACK
17.0000 g | PACK | Freq: Two times a day (BID) | ORAL | Status: DC
  Administered 2018-01-12 – 2018-01-14 (×4): 17 g via ORAL
  Filled 2018-01-12 (×5): qty 1

## 2018-01-12 MED ORDER — WITCH HAZEL-GLYCERIN EX PADS: 1.0000 "application " | MEDICATED_PAD | CUTANEOUS | Status: DC | PRN

## 2018-01-12 MED ORDER — BENZOCAINE-MENTHOL 20-0.5 % EX AERO
1.0000 "application " | INHALATION_SPRAY | CUTANEOUS | Status: DC | PRN
  Administered 2018-01-12: 1 via TOPICAL
  Filled 2018-01-12: qty 56

## 2018-01-12 MED ORDER — VITAMIN K1 1 MG/0.5ML IJ SOLN
INTRAMUSCULAR | Status: AC
  Filled 2018-01-12: qty 0.5

## 2018-01-12 MED ORDER — MAGNESIUM SULFATE 40 G IN LACTATED RINGERS - SIMPLE: 1.0000 g/h | INTRAVENOUS | Status: DC

## 2018-01-12 MED ORDER — TRANEXAMIC ACID 1000 MG/10ML IV SOLN
1000.0000 mg | Freq: Once | INTRAVENOUS | Status: DC | PRN
  Filled 2018-01-12: qty 10

## 2018-01-12 MED ORDER — LACTATED RINGERS IV SOLN
INTRAVENOUS | Status: DC
  Administered 2018-01-12 (×2): via INTRAVENOUS
  Administered 2018-01-13: 100 mL/h via INTRAVENOUS

## 2018-01-12 MED ORDER — IBUPROFEN 600 MG PO TABS
600.0000 mg | ORAL_TABLET | Freq: Four times a day (QID) | ORAL | Status: DC
  Administered 2018-01-12 – 2018-01-14 (×9): 600 mg via ORAL
  Filled 2018-01-12 (×9): qty 1

## 2018-01-12 MED ORDER — SENNOSIDES-DOCUSATE SODIUM 8.6-50 MG PO TABS
2.0000 | ORAL_TABLET | ORAL | Status: DC
  Administered 2018-01-12 – 2018-01-14 (×2): 2 via ORAL
  Filled 2018-01-12 (×2): qty 2

## 2018-01-12 MED ORDER — MEASLES, MUMPS & RUBELLA VAC ~~LOC~~ INJ
0.5000 mL | INJECTION | Freq: Once | SUBCUTANEOUS | Status: DC
  Filled 2018-01-12: qty 0.5

## 2018-01-12 MED ORDER — TETANUS-DIPHTH-ACELL PERTUSSIS 5-2.5-18.5 LF-MCG/0.5 IM SUSP: 0.5000 mL | Freq: Once | INTRAMUSCULAR | Status: DC

## 2018-01-12 MED ORDER — ENALAPRIL MALEATE 5 MG PO TABS
5.0000 mg | ORAL_TABLET | Freq: Every day | ORAL | Status: DC
  Administered 2018-01-12 – 2018-01-13 (×2): 5 mg via ORAL
  Filled 2018-01-12 (×3): qty 1

## 2018-01-12 MED ORDER — SIMETHICONE 80 MG PO CHEW: 80.0000 mg | CHEWABLE_TABLET | ORAL | Status: DC | PRN

## 2018-01-12 MED ORDER — COCONUT OIL OIL: 1.0000 "application " | TOPICAL_OIL | Status: DC | PRN

## 2018-01-12 MED ORDER — MAGNESIUM SULFATE 40 G IN LACTATED RINGERS - SIMPLE: 1.0000 g/h | INTRAVENOUS | Status: AC

## 2018-01-12 MED ORDER — INFLUENZA VAC SPLIT QUAD 0.5 ML IM SUSY
0.5000 mL | PREFILLED_SYRINGE | INTRAMUSCULAR | Status: AC
  Administered 2018-01-14: 0.5 mL via INTRAMUSCULAR
  Filled 2018-01-12: qty 0.5

## 2018-01-12 MED ORDER — MISOPROSTOL 200 MCG PO TABS
1000.0000 ug | ORAL_TABLET | Freq: Once | ORAL | Status: AC
  Administered 2018-01-12: 1000 ug via RECTAL

## 2018-01-12 MED ORDER — PRENATAL MULTIVITAMIN CH
1.0000 | ORAL_TABLET | Freq: Every day | ORAL | Status: DC
  Administered 2018-01-12 – 2018-01-14 (×3): 1 via ORAL
  Filled 2018-01-12 (×3): qty 1

## 2018-01-12 NOTE — Lactation Note (Signed)
This note was copied from a baby's chart. Lactation Consultation Note  Patient Name: Amanda Bright [id number]'V Date: 01/12/2018 Reason for consult: Initial assessment;Difficult latch;1st time breastfeeding   P1, Baby 8 hours old.  Mother on MagSo4.   Per mother and RN baby has had difficulty sustaining latch. Noted disorganized suck. Assisted mother in both cross cradle and football hold. Baby latched on and off for more than 20 min. Mother worried about baby breathing while breastfeeding. Provided education and encouraged mother to support her breast and bring baby deep on breast to stimulate him to suck. RN set up DEBP and mother spoon fed baby drops of colostrum. Encouraged mother to continue to spoon feed with each feeding and pump after every other feeding. Recommend mother post pump 4-6 times per day for 10-20 min with DEBP on initiation setting. Give baby back volume pumped at the next feeding. Reviewed cleaning and milk storage.  Mom made aware of O/P services, breastfeeding support groups, community resources, and our phone # for post-discharge questions.  Mom encouraged to feed baby 8-12 times/24 hours and with feeding cues.     Maternal Data Has patient been taught Hand Expression?: Yes Does the patient have breastfeeding experience prior to this delivery?: No  Feeding Feeding Type: Breast Fed  LATCH Score Latch: Repeated attempts needed to sustain latch, nipple held in mouth throughout feeding, stimulation needed to elicit sucking reflex.  Audible Swallowing: A few with stimulation  Type of Nipple: Everted at rest and after stimulation  Comfort (Breast/Nipple): Soft / non-tender  Hold (Positioning): Assistance needed to correctly position infant at breast and maintain latch.  LATCH Score: 7  Interventions Interventions: Breast feeding basics reviewed;Assisted with latch;Skin to skin;Breast massage;Hand express;Breast compression;Adjust position;Support  pillows;Position options;DEBP  Lactation Tools Discussed/Used Pump Review: Setup, frequency, and cleaning;Milk Storage Initiated by:: Glory Rosebush, RN Date initiated:: 01/12/18   Consult Status Consult Status: Follow-up Date: 01/13/18 Follow-up type: In-patient    Dahlia Byes [facility] 01/12/2018, 3:01 PM

## 2018-01-12 NOTE — Progress Notes (Signed)
LABOR PROGRESS NOTE  Amanda Bright is a 22 y.o. G1P0000 at [redacted]w[redacted]d  admitted for IOL for Pre-Eclampsia.   Subjective: Very uncomfortable with ctx.   Objective: BP (!) 152/93   Pulse (!) 106   Temp 98.5 F (36.9 C) (Oral)   Resp 18   Ht 5\' 4"  (1.626 m)   Wt 72.3 kg   SpO2 97%   BMI 27.38 kg/m  or  Vitals:   01/12/18 0131 01/12/18 0301 01/12/18 0431 01/12/18 0446  BP: (!) 139/91 133/87 (!) 152/93   Pulse: 79 78 (!) 106   Resp: 18 20 18    Temp:  98.5 F (36.9 C)    TempSrc:  Oral    SpO2:  97%  97%  Weight:      Height:        Dilation: Lip/rim Effacement (%): 100 Station: 0 Presentation: Vertex Exam by:: Dr. Earlene Plater FHT: baseline rate 125, moderate varibility, +acel, no decel Toco: q1-2 min   Labs: Lab Results  Component Value Date   WBC 9.4 01/12/2018   HGB 8.9 (L) 01/12/2018   HCT 28.6 (L) 01/12/2018   MCV 77.3 (L) 01/12/2018   PLT 204 01/12/2018    Patient Active Problem List   Diagnosis Date Noted  . Preeclampsia 01/11/2018  . Pregnancy 01/11/2018  . Supervision of normal first pregnancy, antepartum 09/07/2017    Assessment / Plan: 22 y.o. G1P0000 at [redacted]w[redacted]d here for IOL for Pre-E.   Pre-E: Patient on Mag Sulfate at 1 gm/hr. Monitor closely. BPs remain elevated, no recent severe range pressures.  Labor: Active labor. Pitocin running. AROM with minimal fluid and bloody show.  Fetal Wellbeing:  Cat I  Pain Control:  Epidural in place.  Anticipated MOD:  NSVD   Marcy Siren, D.O. OB Fellow  01/12/2018, 5:41 AM

## 2018-01-12 NOTE — Anesthesia Postprocedure Evaluation (Signed)
Anesthesia Post Note  Patient: Amanda Bright  Procedure(s) Performed: AN AD HOC EPIDURAL     Patient location during evaluation: Women's Unit Anesthesia Type: Epidural Level of consciousness: awake and alert, patient cooperative and oriented Pain management: pain level controlled Vital Signs Assessment: post-procedure vital signs reviewed and stable Respiratory status: spontaneous breathing Cardiovascular status: blood pressure returned to baseline Postop Assessment: no headache, epidural receding, patient able to bend at knees, adequate PO intake, no backache, no apparent nausea or vomiting and able to ambulate Anesthetic complications: no    Last Vitals:  Vitals:   01/12/18 1245 01/12/18 1345  BP:    Pulse:    Resp: 18 18  Temp:    SpO2:      Last Pain:  Vitals:   01/12/18 1345  TempSrc:   PainSc: 0-No pain   Pain Goal:                 Cleda Clarks

## 2018-01-12 NOTE — Anesthesia Procedure Notes (Signed)
Epidural Patient location during procedure: OB Start time: 01/12/2018 11:35 PM End time: 01/12/2018 11:50 PM  Staffing Anesthesiologist: Elmer Picker, MD Performed: anesthesiologist   Preanesthetic Checklist Completed: patient identified, pre-op evaluation, timeout performed, IV checked, risks and benefits discussed and monitors and equipment checked  Epidural Patient position: sitting Prep: site prepped and draped and DuraPrep Patient monitoring: continuous pulse ox, blood pressure, heart rate and cardiac monitor Approach: midline Location: L3-L4 Injection technique: LOR air  Needle:  Needle type: Tuohy  Needle gauge: 17 G Needle length: 9 cm Needle insertion depth: 5 cm Catheter type: closed end flexible Catheter size: 19 Gauge Catheter at skin depth: 10 cm Test dose: negative  Assessment Sensory level: T8 Events: blood not aspirated, injection not painful, no injection resistance, negative IV test and no paresthesia  Additional Notes Patient identified. Risks/Benefits/Options discussed with patient including but not limited to bleeding, infection, nerve damage, paralysis, failed block, incomplete pain control, headache, blood pressure changes, nausea, vomiting, reactions to medication both or allergic, itching and postpartum back pain. Confirmed with bedside nurse the patient's most recent platelet count. Confirmed with patient that they are not currently taking any anticoagulation, have any bleeding history or any family history of bleeding disorders. Patient expressed understanding and wished to proceed. All questions were answered. Sterile technique was used throughout the entire procedure. Please see nursing notes for vital signs. Test dose was given through epidural catheter and negative prior to continuing to dose epidural or start infusion. Warning signs of high block given to the patient including shortness of breath, tingling/numbness in hands, complete motor block,  or any concerning symptoms with instructions to call for help. Patient was given instructions on fall risk and not to get out of bed. All questions and concerns addressed with instructions to call with any issues or inadequate analgesia.  Reason for block:procedure for pain

## 2018-01-13 MED ORDER — FERROUS SULFATE 325 (65 FE) MG PO TABS
325.0000 mg | ORAL_TABLET | Freq: Three times a day (TID) | ORAL | Status: DC
  Administered 2018-01-13 – 2018-01-14 (×4): 325 mg via ORAL
  Filled 2018-01-13 (×4): qty 1

## 2018-01-13 MED ORDER — DOCUSATE SODIUM 100 MG PO CAPS: 100.0000 mg | ORAL_CAPSULE | Freq: Two times a day (BID) | ORAL | Status: DC | PRN

## 2018-01-13 NOTE — Progress Notes (Signed)
Post Partum Day 1 s/p SVD after IOL for severe preeclampsia at [redacted]w[redacted]d  Subjective: No complaints, up ad lib, voiding, tolerating PO and + flatus. Baby is table at bedside. No headaches, epigastric/RUQ pain.  Moderate lochia, no heavy bleeding noted. Objective: Blood pressure 110/90, pulse 82, temperature 98.7 F (37.1 C), temperature source Oral, resp. rate 17, height 5\' 4"  (1.626 m), weight 72.3 kg, SpO2 99 %, unknown if currently breastfeeding. Patient Vitals for the past 24 hrs:  BP Temp Temp src Pulse Resp SpO2  01/13/18 0743 110/90 98.7 F (37.1 C) Oral 82 17 99 %  01/13/18 0645 - - - - 16 99 %  01/13/18 0545 - - - - 16 98 %  01/13/18 0445 - - - - 16 98 %  01/13/18 0351 123/86 98.3 F (36.8 C) Oral 84 18 99 %  01/13/18 0345 - - - - 16 98 %  01/13/18 0245 - - - - 16 98 %  01/13/18 0145 - - - - 18 98 %  01/13/18 0045 - - - - 18 98 %  01/12/18 2353 139/87 98.6 F (37 C) Oral 96 16 99 %  01/12/18 2345 - - - - 18 98 %  01/12/18 2245 - - - - 18 98 %  01/12/18 2145 - - - - 18 98 %  01/12/18 2045 - - - - 16 98 %  01/12/18 1946 133/89 98.2 F (36.8 C) Oral 84 16 97 %  01/12/18 1845 - - - - 18 -  01/12/18 1745 - - - - 18 -  01/12/18 1622 (!) 132/92 99.2 F (37.3 C) Oral 94 18 100 %  01/12/18 1545 - - - - 18 -  01/12/18 1445 - - - - 18 -  01/12/18 1345 - - - - 18 -  01/12/18 1245 - - - - 18 -  01/12/18 1152 (!) 144/95 99.4 F (37.4 C) Oral 91 18 99 %  01/12/18 1141 - - - - 18 -    Physical Exam:  General: alert and no distress Lochia: appropriate Uterine Fundus: firm DVT Evaluation: No evidence of DVT seen on physical exam. Negative Homan's sign. No cords or calf tenderness.  Recent Labs    01/12/18 0224 01/12/18 0753  HGB 8.9* 9.2*  HCT 28.6* 29.2*    Assessment/Plan: Stable BP, will continue to monitor Oral iron started for anemia Depo for contraception Continue routine postpartum care Likely discharge tomorrow   LOS: 2 days   Jaynie Collins,  MD 01/13/2018, 11:06 AM

## 2018-01-14 DIAGNOSIS — Z3A39 39 weeks gestation of pregnancy: Secondary | ICD-10-CM

## 2018-01-14 DIAGNOSIS — O1494 Unspecified pre-eclampsia, complicating childbirth: Secondary | ICD-10-CM

## 2018-01-14 MED ORDER — ENALAPRIL MALEATE 10 MG PO TABS
10.0000 mg | ORAL_TABLET | Freq: Every day | ORAL | Status: DC
  Administered 2018-01-14: 10 mg via ORAL
  Filled 2018-01-14 (×2): qty 1

## 2018-01-14 MED ORDER — FERROUS SULFATE 325 (65 FE) MG PO TABS: 325.0000 mg | ORAL_TABLET | Freq: Three times a day (TID) | ORAL | 3 refills | Status: DC

## 2018-01-14 MED ORDER — PRENATAL MULTIVITAMIN CH: 1.0000 | ORAL_TABLET | Freq: Every day | ORAL | 1 refills | Status: DC

## 2018-01-14 MED ORDER — ENALAPRIL MALEATE 10 MG PO TABS: 10.0000 mg | ORAL_TABLET | Freq: Every day | ORAL | 1 refills | Status: DC

## 2018-01-14 MED ORDER — IBUPROFEN 600 MG PO TABS: 600.0000 mg | ORAL_TABLET | Freq: Four times a day (QID) | ORAL | 0 refills | Status: DC

## 2018-01-14 NOTE — Lactation Note (Signed)
This note was copied from a baby's chart. Lactation Consultation Note; Follow up visit with this mom post Mag. She reports she has no milk so is mostly bottle feeding formula. Offered assist with latch and mom agreeable. Assisted mom with football hold. She states this is the first time she has tried it. Reports she feels very comfortable. Reviewed basics. Baby had formula 2 hours ago and took a few sucks then off to sleep. Encouraged to always breast feed first, then offer formula if baby is still hungry.  Can give a little formula if baby is frantic then try to latch baby. Reviewed importance of frequent nursing or pumping to promote milk supply. Reports she pumped twice yesterday but did not obtain any Colostrum. Encouragement given. [place] I will fax pump referral to them. No questions at present. Reviewed our phone number, OP appointments and BFSG as resources for support after DC. TO call prn  Patient Name: Amanda Lonia ChimeraCristina Wiers ZOXWR'UToday's Date: 01/14/2018 Reason for consult: Follow-up assessment;Other (Comment)( Mom on Mag)   Maternal Data Has patient been taught Hand Expression?: Yes Does the patient have breastfeeding experience prior to this delivery?: No  Feeding Feeding Type: Breast Fed Nipple Type: Slow - flow  LATCH Score Latch: Too sleepy or reluctant, no latch achieved, no sucking elicited.  Audible Swallowing: None  Type of Nipple: Everted at rest and after stimulation  Comfort (Breast/Nipple): Soft / non-tender  Hold (Positioning): Assistance needed to correctly position infant at breast and maintain latch.  LATCH Score: 5  Interventions Interventions: Breast feeding basics reviewed;Assisted with latch;Hand express;Position options  Lactation Tools Discussed/Used WIC Program: Yes   Consult Status Consult Status: Complete    Pamelia HoitWeeks, Gursimran Litaker D 01/14/2018, 9:00 AM

## 2018-01-14 NOTE — Progress Notes (Signed)
Pt out in wheelchair  Teaching complete   

## 2018-01-14 NOTE — Anesthesia Postprocedure Evaluation (Signed)
Anesthesia Post Note  Patient: Amanda Bright  Procedure(s) Performed: AN AD HOC LABOR EPIDURAL     Patient location during evaluation: Mother Baby Anesthesia Type: Epidural Level of consciousness: awake and alert Pain management: pain level controlled Vital Signs Assessment: post-procedure vital signs reviewed and stable Respiratory status: spontaneous breathing, nonlabored ventilation and respiratory function stable Cardiovascular status: stable Postop Assessment: no headache, no backache and epidural receding Anesthetic complications: no    Last Vitals: There were no vitals filed for this visit.  Last Pain: There were no vitals filed for this visit.               Amanda Bright

## 2018-01-14 NOTE — Discharge Instructions (Signed)
Vaginal Delivery, Care After °Refer to this sheet in the next few weeks. These instructions provide you with information about caring for yourself after vaginal delivery. Your health care provider may also give you more specific instructions. Your treatment has been planned according to current medical practices, but problems sometimes occur. Call your health care provider if you have any problems or questions. °What can I expect after the procedure? °After vaginal delivery, it is common to have: °· Some bleeding from your vagina. °· Soreness in your abdomen, your vagina, and the area of skin between your vaginal opening and your anus (perineum). °· Pelvic cramps. °· Fatigue. ° °Follow these instructions at home: °Medicines °· Take over-the-counter and prescription medicines only as told by your health care provider. °· If you were prescribed an antibiotic medicine, take it as told by your health care provider. Do not stop taking the antibiotic until it is finished. °Driving ° °· Do not drive or operate heavy machinery while taking prescription pain medicine. °· Do not drive for 24 hours if you received a sedative. °Lifestyle °· Do not drink alcohol. This is especially important if you are breastfeeding or taking medicine to relieve pain. °· Do not use tobacco products, including cigarettes, chewing tobacco, or e-cigarettes. If you need help quitting, ask your health care provider. °Eating and drinking °· Drink at least 8 eight-ounce glasses of water every day unless you are told not to by your health care provider. If you choose to breastfeed your baby, you may need to drink more water than this. °· Eat high-fiber foods every day. These foods may help prevent or relieve constipation. High-fiber foods include: °? Whole grain cereals and breads. °? Brown rice. °? Beans. °? Fresh fruits and vegetables. °Activity °· Return to your normal activities as told by your health care provider. Ask your health care provider  what activities are safe for you. °· Rest as much as possible. Try to rest or take a nap when your baby is sleeping. °· Do not lift anything that is heavier than your baby or 10 lb (4.5 kg) until your health care provider says that it is safe. °· Talk with your health care provider about when you can engage in sexual activity. This may depend on your: °? Risk of infection. °? Rate of healing. °? Comfort and desire to engage in sexual activity. °Vaginal Care °· If you have an episiotomy or a vaginal tear, check the area every day for signs of infection. Check for: °? More redness, swelling, or pain. °? More fluid or blood. °? Warmth. °? Pus or a bad smell. °· Do not use tampons or douches until your health care provider says this is safe. °· Watch for any blood clots that may pass from your vagina. These may look like clumps of dark red, brown, or black discharge. °General instructions °· Keep your perineum clean and dry as told by your health care provider. °· Wear loose, comfortable clothing. °· Wipe from front to back when you use the toilet. °· Ask your health care provider if you can shower or take a bath. If you had an episiotomy or a perineal tear during labor and delivery, your health care provider may tell you not to take baths for a certain length of time. °· Wear a bra that supports your breasts and fits you well. °· If possible, have someone help you with household activities and help care for your baby for at least a few days after   you leave the hospital. °· Keep all follow-up visits for you and your baby as told by your health care provider. This is important. °Contact a health care provider if: °· You have: °? Vaginal discharge that has a bad smell. °? Difficulty urinating. °? Pain when urinating. °? A sudden increase or decrease in the frequency of your bowel movements. °? More redness, swelling, or pain around your episiotomy or vaginal tear. °? More fluid or blood coming from your episiotomy or  vaginal tear. °? Pus or a bad smell coming from your episiotomy or vaginal tear. °? A fever. °? A rash. °? Little or no interest in activities you used to enjoy. °? Questions about caring for yourself or your baby. °· Your episiotomy or vaginal tear feels warm to the touch. °· Your episiotomy or vaginal tear is separating or does not appear to be healing. °· Your breasts are painful, hard, or turn red. °· You feel unusually sad or worried. °· You feel nauseous or you vomit. °· You pass large blood clots from your vagina. If you pass a blood clot from your vagina, save it to show to your health care provider. Do not flush blood clots down the toilet without having your health care provider look at them. °· You urinate more than usual. °· You are dizzy or light-headed. °· You have not breastfed at all and you have not had a menstrual period for 12 weeks after delivery. °· You have stopped breastfeeding and you have not had a menstrual period for 12 weeks after you stopped breastfeeding. °Get help right away if: °· You have: °? Pain that does not go away or does not get better with medicine. °? Chest pain. °? Difficulty breathing. °? Blurred vision or spots in your vision. °? Thoughts about hurting yourself or your baby. °· You develop pain in your abdomen or in one of your legs. °· You develop a severe headache. °· You faint. °· You bleed from your vagina so much that you fill two sanitary pads in one hour. °This information is not intended to replace advice given to you by your health care provider. Make sure you discuss any questions you have with your health care provider. °Document Released: 04/18/2000 Document Revised: 10/03/2015 Document Reviewed: 05/06/2015 °Elsevier Interactive Patient Education © 2018 [employer] ° °

## 2018-01-14 NOTE — Discharge Summary (Signed)
Postpartum Discharge Summary     Patient Name: Amanda ChimeraCristina Sanchez DOB: 12-10-1995 MRN: [id number]  Date of admission: 01/11/2018 Delivering Provider: Arvilla MarketWALLACE, CATHERINE LAUREN   Date of discharge: 01/14/2018  Admitting diagnosis: pre eclampsia Intrauterine pregnancy: 6823w4d     Secondary diagnosis:  Active Problems:   Preeclampsia   Pregnancy  Additional problems: pre eclampsia     Discharge diagnosis: Term Pregnancy Delivered                                                                                                Post partum procedures:  Augmentation: Pitocin  Complications: None  Hospital course:  Induction of Labor With Vaginal Delivery   22 y.o. yo G1P1001 at 6423w4d was admitted to the hospital 01/11/2018 for induction of labor.  Indication for induction: Preeclampsia.  Patient had an uncomplicated labor course as follows: Membrane Rupture Time/Date: 5:30 AM ,01/12/2018   Intrapartum Procedures: Episiotomy: None [1]                                         Lacerations:  3rd degree [4]  Patient had delivery of a Viable infant.  Information for the patient's newborn:  Victorino DikeJuarez, Boy Keylah [[id number]][[id number]]  Delivery Method: Vag-Spont   01/12/2018  Details of delivery can be found in separate delivery note.  Patient had a routine postpartum course. Patient is discharged home 01/14/18.  Magnesium Sulfate recieved: Yes BMZ received: No  Physical exam  Vitals:   01/13/18 2013 01/13/18 2325 01/14/18 0408 01/14/18 0748  BP: 135/87 132/90 (!) 142/88 (!) 142/93  Pulse: 91 79 73 66  Resp: 20 18 18 17   Temp: 98.3 F (36.8 C) 98.5 F (36.9 C) 97.8 F (36.6 C) 98.6 F (37 C)  TempSrc: Oral Oral Oral Oral  SpO2: 98% 99% 98% 100%  Weight:      Height:       General: alert Lochia: appropriate Uterine Fundus: firm Incision:  DVT Evaluation: No evidence of DVT seen on physical exam. Labs: Lab Results  Component Value Date   WBC 13.6 (H) 01/12/2018   HGB 9.2 (L) 01/12/2018    HCT 29.2 (L) 01/12/2018   MCV 76.8 (L) 01/12/2018   PLT 220 01/12/2018   CMP Latest Ref Rng & Units 01/12/2018  Glucose 70 - 99 mg/dL 90  BUN 6 - 20 mg/dL 6  Creatinine 7.820.44 - 9.561.00 mg/dL 2.130.61  Sodium 086135 - 578145 mmol/L 134(L)  Potassium 3.5 - 5.1 mmol/L 3.8  Chloride 98 - 111 mmol/L 103  CO2 22 - 32 mmol/L 18(L)  Calcium 8.9 - 10.3 mg/dL 8.2(L)  Total Protein 6.5 - 8.1 g/dL 6.2(L)  Total Bilirubin 0.3 - 1.2 mg/dL 0.7  Alkaline Phos 38 - 126 U/L 250(H)  AST 15 - 41 U/L 25  ALT 0 - 44 U/L 13    Discharge instruction: per After Visit Summary and "Baby and Me Booklet".  After visit meds:  Allergies as of 01/14/2018   No Known Allergies  Medication List    TAKE these medications   enalapril 10 MG tablet Commonly known as:  VASOTEC Take 1 tablet (10 mg total) by mouth daily.   ferrous sulfate 325 (65 FE) MG tablet Take 1 tablet (325 mg total) by mouth 3 (three) times daily with meals. What changed:  when to take this   ibuprofen 600 MG tablet Commonly known as:  ADVIL,MOTRIN Take 1 tablet (600 mg total) by mouth every 6 (six) hours.   prenatal multivitamin Tabs tablet Take 1 tablet by mouth daily at 12 noon. What changed:  medication strength       Diet: routine diet  Activity: Advance as tolerated. Pelvic rest for 6 weeks.   Outpatient follow up:2 weeks Follow up Appt: Future Appointments  Date Time Provider Department Center  02/15/2018  8:55 AM Armando Reichert, CNM [facility]   Follow up Visit:No follow-ups on file.   Please schedule this patient for Postpartum visit in: 2 weeks with the following provider: RN For C/S patients schedule nurse incision check in weeks 2 weeks:  High risk pregnancy complicated by: pre eclampsia Delivery mode:  SVD Anticipated Birth Control:  Depo PP Procedures needed: BP check  Schedule Integrated BH visit: no      Newborn Data: Live born female  Birth Weight: 7 lb 0.7 oz (3195 g) APGAR: 8, 9  Newborn Delivery    Birth date/time:  01/12/2018 06:47:00 Delivery type:  Vaginal, Spontaneous     Baby Feeding: Breast Disposition:home with mother   01/14/2018 Lazaro Arms, MD

## 2018-01-18 ENCOUNTER — Other Ambulatory Visit: Payer: Self-pay

## 2018-02-15 ENCOUNTER — Encounter: Payer: Self-pay | Admitting: Advanced Practice Midwife

## 2018-02-15 ENCOUNTER — Ambulatory Visit: Payer: Self-pay | Admitting: Advanced Practice Midwife

## 2018-03-09 ENCOUNTER — Encounter: Payer: Self-pay | Admitting: Obstetrics and Gynecology

## 2018-03-09 ENCOUNTER — Ambulatory Visit (INDEPENDENT_AMBULATORY_CARE_PROVIDER_SITE_OTHER): Payer: Self-pay | Admitting: Obstetrics and Gynecology

## 2018-03-09 NOTE — Progress Notes (Signed)
cbcSubjective:     Amanda Bright is a 22 y.o. female who presents for a postpartum visit. She is 6 weeks postpartum following a spontaneous vaginal delivery. I have fully reviewed the prenatal and intrapartum course. The delivery was at 39.3 gestational weeks. Outcome: spontaneous vaginal delivery. Anesthesia: epidural. Postpartum course has been unremarkable. Baby's course has been unremarkable. Baby is feeding by bottle [employer]. She changed him to [employer] from [place]. Bleeding no bleeding. Bowel function is normal. Bladder function is normal. Patient is not sexually active. Contraception method is none. She has plans to get Depo injections at the [facility] d/t not having insurance. Postpartum depression screening: negative.  The following portions of the patient's history were reviewed and updated as appropriate: allergies, current medications, past family history, past medical history, past social history, past surgical history and problem list.  Review of Systems Constitutional: negative Eyes: negative Ears, nose, mouth, throat, and face: negative Respiratory: negative Cardiovascular: negative Gastrointestinal: negative Genitourinary:negative Integument/breast: negative Hematologic/lymphatic: negative Musculoskeletal:negative Neurological: negative Behavioral/Psych: negative Endocrine: negative Allergic/Immunologic: negative   Objective:    There were no vitals taken for this visit.  General:  alert, cooperative, appears stated age and no distress   Breasts:  inspection negative, no nipple discharge or bleeding, no masses or nodularity palpable  Lungs: clear to auscultation bilaterally  Heart:  regular rate and rhythm, S1, S2 normal, no murmur, click, rub or gallop  Abdomen: soft, non-tender; bowel sounds normal; no masses,  no organomegaly   Vulva:  normal  Vagina: 3rd degree laceration well-healed except for small, pinpoint area at introitus that is not completely healed  at this time. Weak tone to pelvic floor muscles.  Cervix:  not examined  Corpus: not examined  Adnexa:  not evaluated  Rectal Exam: Not performed.        Assessment:     Normal postpartum exam. Pap smear not done at today's visit.   Plan:   1. Contraception: Depo-Provera injections with [facility] 2. Advised to sit in warm tub of water with Epsom salt BID x 2 wks // may resume SI in 2 wks // advised to purchase Platinum Wet lubrication to use prior to SI  3. Follow up in: 3 months or as needed.

## 2018-03-10 LAB — CBC
HEMOGLOBIN: 9.4 g/dL — AB (ref 11.1–15.9)
Hematocrit: 29.5 % — ABNORMAL LOW (ref 34.0–46.6)
MCH: 23 pg — AB (ref 26.6–33.0)
MCHC: 31.9 g/dL (ref 31.5–35.7)
MCV: 72 fL — AB (ref 79–97)
Platelets: 415 10*3/uL (ref 150–450)
RBC: 4.08 x10E6/uL (ref 3.77–5.28)
RDW: 18.3 % — ABNORMAL HIGH (ref 12.3–15.4)
WBC: 6.3 10*3/uL (ref 3.4–10.8)

## 2018-07-14 IMAGING — US US MFM OB FOLLOW-UP
1 series · 14 of 28 positions shown · non-contrast
Comparison: none

[Series 1: us mfm ob follow-up · 65 acquisitions, 14 frames shown]
[im 3/65]
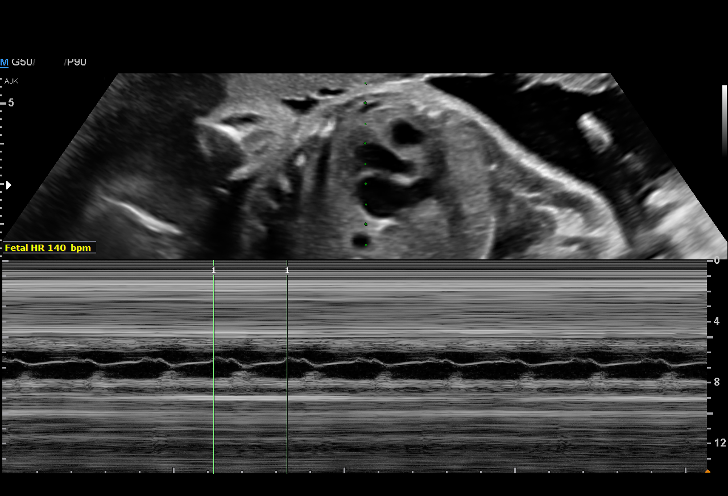
[im 8/65]
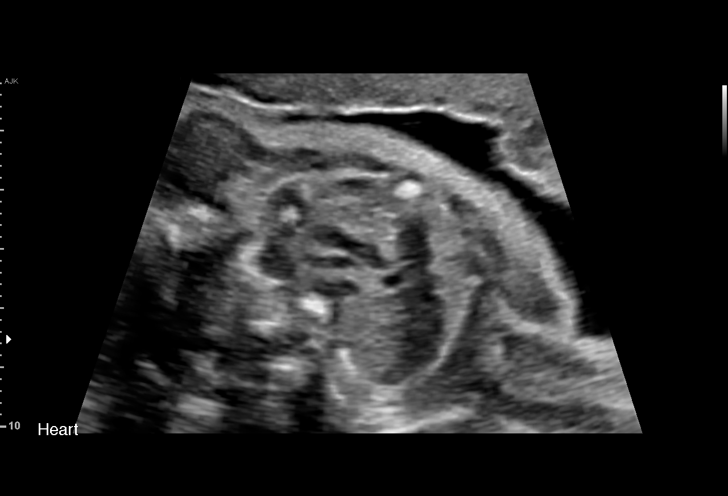
[im 12/65]
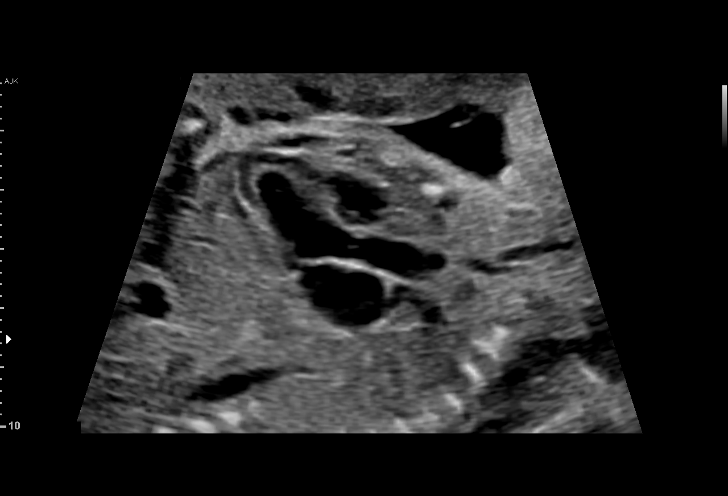
[im 17/65]
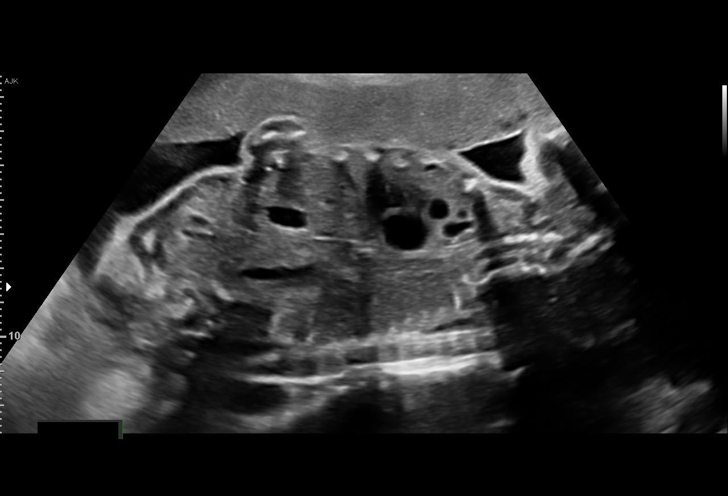
[im 22/65]
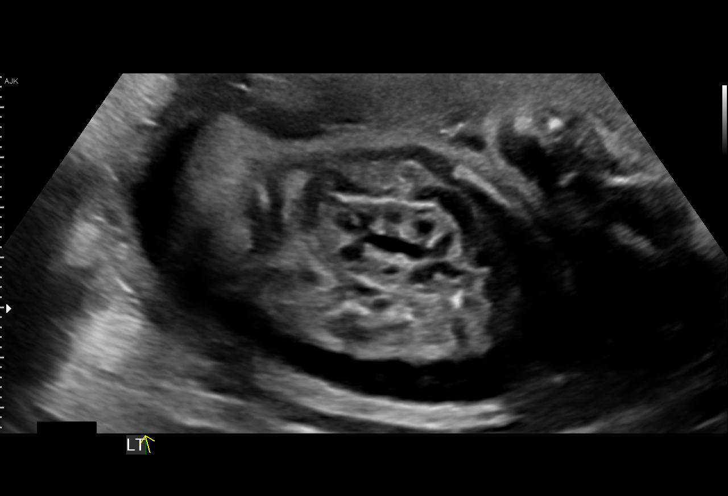
[im 27/65]
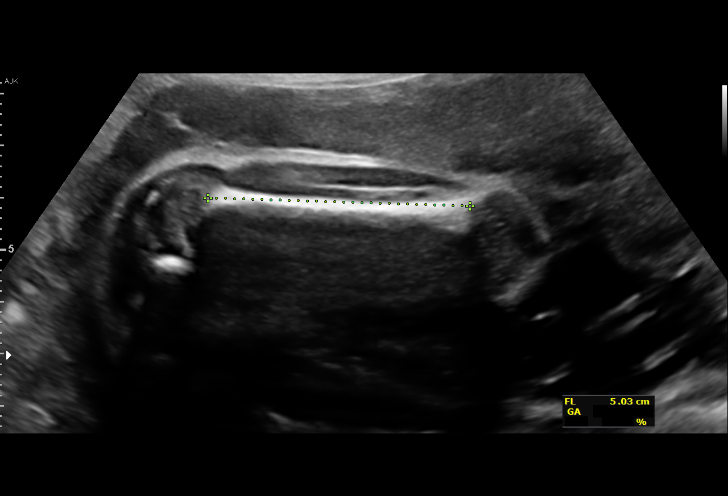
[im 31/65]
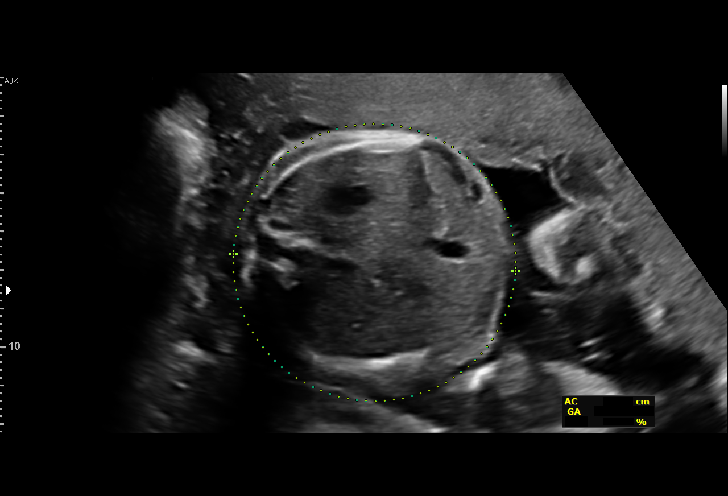
[im 36/65]
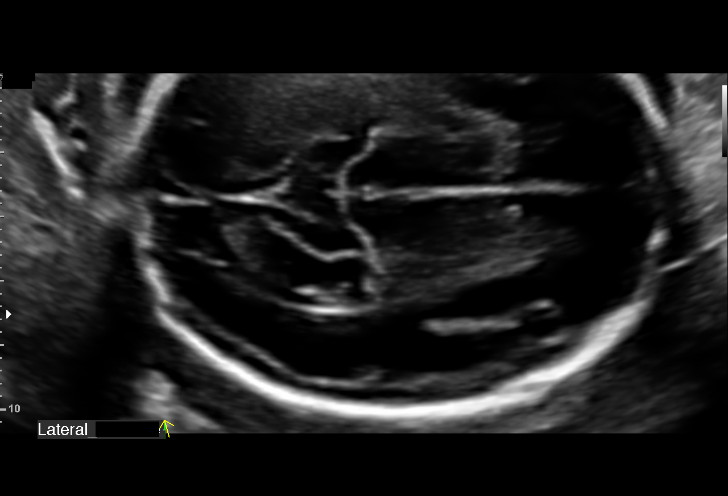
[im 41/65]
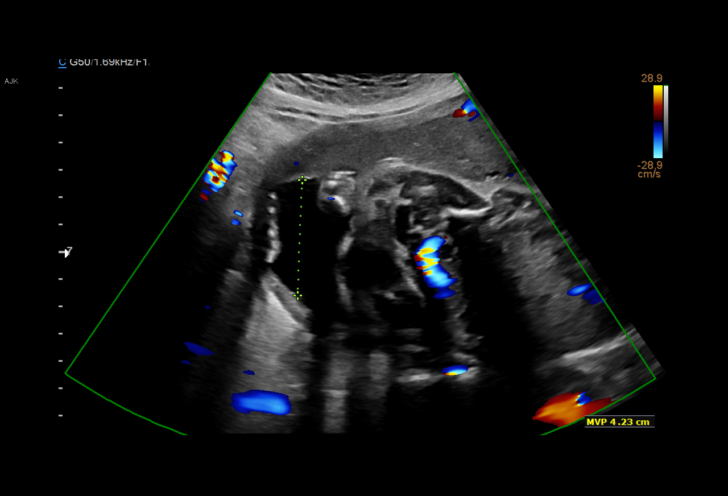
[im 46/65]
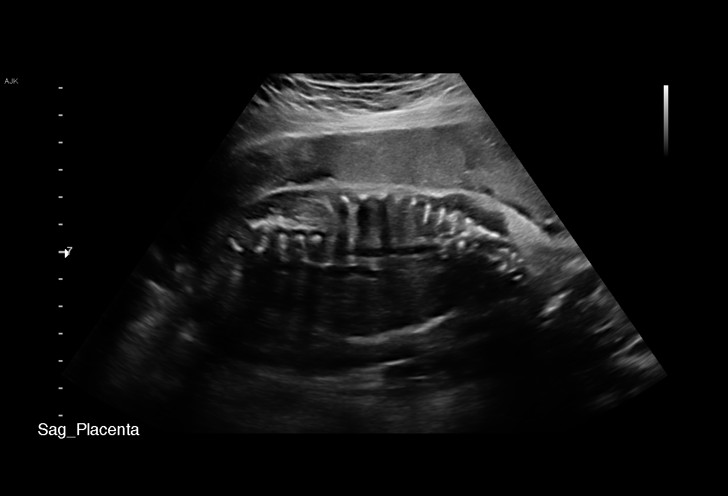
[im 50/65]
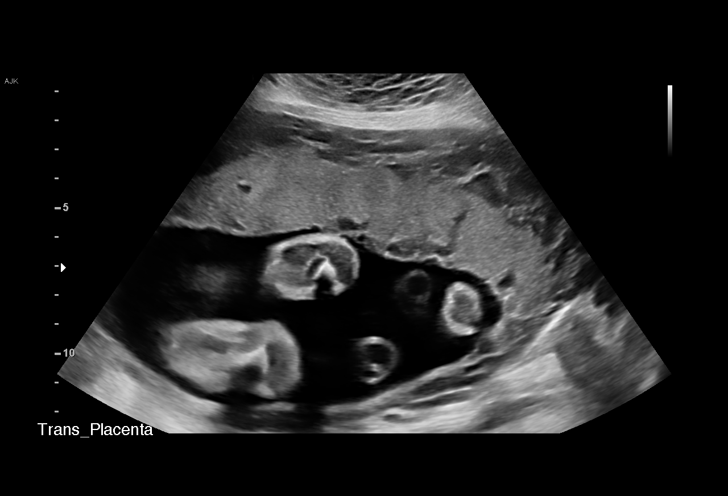
[im 55/65]
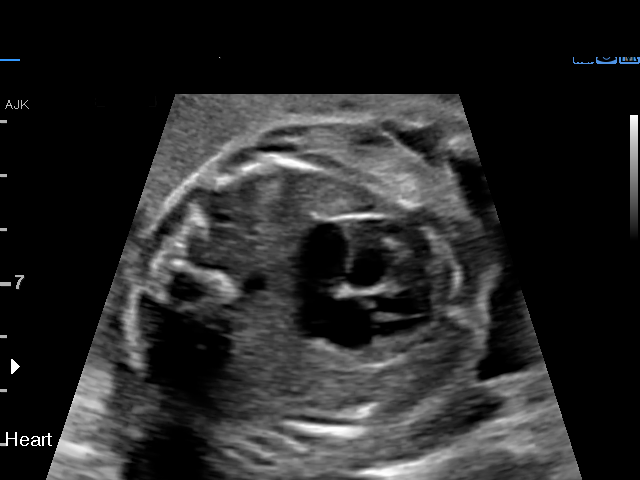
[im 60/65]
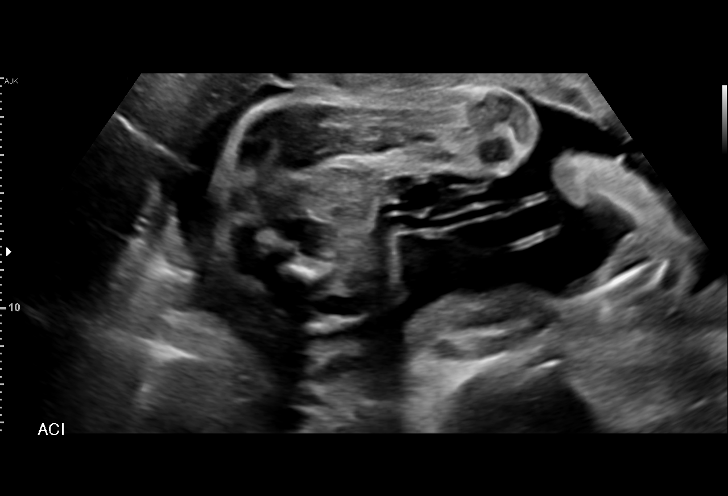
[im 65/65]
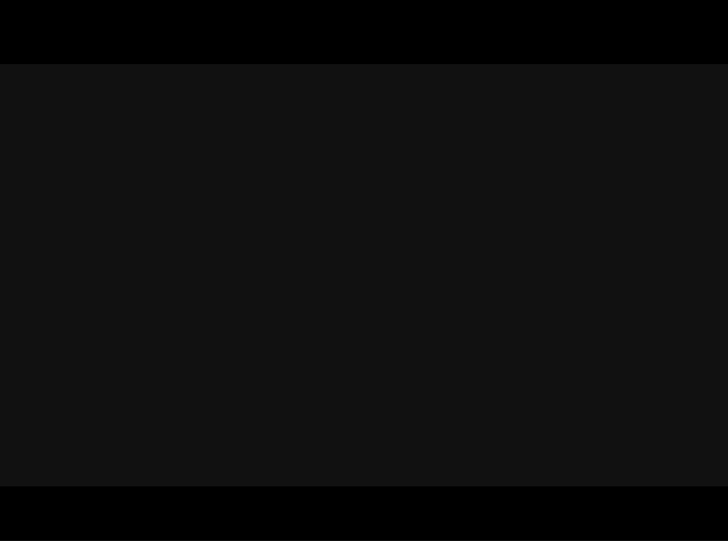

[14 of 28 positions shown; findings below may reference images not displayed]

[REDACTED]. [HOSPITAL],

1  LUSINE JIM            [id number]      [id number]     [id number]
Indications

26 weeks gestation of pregnancy
Encounter for other antenatal screening
follow-up
OB History

Blood Type:            Height:  5'4"   Weight (lb):  132       BMI:
Gravidity:    1
Fetal Evaluation

Num Of Fetuses:     1
Fetal Heart         140
Rate(bpm):
Cardiac Activity:   Observed
Presentation:       Cephalic
Placenta:           Anterior, above cervical os
P. Cord Insertion:  Visualized

Amniotic Fluid
AFI FV:      Subjectively within normal limits

Largest Pocket(cm)
4.23
Biometry

BPD:      67.7  mm     G. Age:  27w 2d         68  %    CI:        74.79   %    70 - 86
FL/HC:      19.9   %    18.6 -
HC:      248.4  mm     G. Age:  27w 0d         43  %    HC/AC:      1.09        1.04 -
AC:      228.2  mm     G. Age:  27w 2d         65  %    FL/BPD:     73.0   %    71 - 87
FL:       49.4  mm     G. Age:  26w 5d         43  %    FL/AC:      21.6   %    20 - 24
Est. FW:    0600  gm      2 lb 4 oz     65  %
Gestational Age

U/S Today:     27w 1d                                        EDD:   01/10/18
Best:          26w 3d     Det. By:  U/S  (09/08/17)          EDD:   01/15/18
Anatomy

Cranium:               Appears normal         Aortic Arch:            Previously seen
Cavum:                 Appears normal         Ductal Arch:            Previously seen
Ventricles:            Appears normal         Diaphragm:              Appears normal
Choroid Plexus:        Previously seen        Stomach:                Appears normal, left
sided
Cerebellum:            Previously seen        Abdomen:                Appears normal
Posterior Fossa:       Previously seen        Abdominal Wall:         Appears nml (cord
insert, abd wall)
Nuchal Fold:           Not applicable (>20    Cord Vessels:           Appears normal (3
wks GA)                                        vessel cord)
Face:                  Orbits previously      Kidneys:                Appear normal
seen
Lips:                  Previously seen        Bladder:                Appears normal
Thoracic:              Appears normal         Spine:                  Previously seen
Heart:                 Appears normal         Upper Extremities:      Previously seen
(4CH, axis, and situs
RVOT:                  Appears normal         Lower Extremities:      Previously seen
LVOT:                  Appears normal

Other:  Fetus appears to be a male. Nasal bone visualized. Technically
difficult due to fetal position.
Cervix Uterus Adnexa

Cervix
Length:           3.09  cm.
Normal appearance by transabdominal scan.

Uterus
No abnormality visualized.

Left Ovary
Within normal limits.

Right Ovary
Within normal limits.

Adnexa:       No abnormality visualized.
Impression

Single living intrauterine pregnancy at 26w 3d.
Cephalic presentation.
Placenta Anterior, above cervical os.
Normal amniotic fluid volume.
Appropriate interval fetal growth.
Normal interval fetal anatomy.
Recommendations

Follow-up ultrasounds as clinically indicated.

## 2022-08-20 ENCOUNTER — Emergency Department (HOSPITAL_COMMUNITY)
Admission: EM | Admit: 2022-08-20 | Discharge: 2022-08-21 | Disposition: A | Discharge status: Home / Self Care | Payer: No Typology Code available for payment source | Attending: Emergency Medicine | Admitting: Emergency Medicine

## 2022-08-20 ENCOUNTER — Encounter (HOSPITAL_COMMUNITY): Payer: Self-pay

## 2022-08-20 DIAGNOSIS — T754XXA Electrocution, initial encounter: Secondary | ICD-10-CM | POA: Diagnosis present
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
## 2023-06-15 ENCOUNTER — Ambulatory Visit (HOSPITAL_BASED_OUTPATIENT_CLINIC_OR_DEPARTMENT_OTHER)
Admission: EM | Admit: 2023-06-15 | Discharge: 2023-06-15 | Disposition: A | Discharge status: Home / Self Care | Payer: Self-pay | Attending: Physician Assistant | Admitting: Physician Assistant

## 2023-06-15 ENCOUNTER — Encounter (HOSPITAL_BASED_OUTPATIENT_CLINIC_OR_DEPARTMENT_OTHER): Payer: Self-pay

## 2023-06-15 DIAGNOSIS — A5602 Chlamydial vulvovaginitis: Secondary | ICD-10-CM | POA: Insufficient documentation

## 2023-06-15 DIAGNOSIS — B9689 Other specified bacterial agents as the cause of diseases classified elsewhere: Secondary | ICD-10-CM | POA: Insufficient documentation

## 2023-06-15 DIAGNOSIS — N760 Acute vaginitis: Secondary | ICD-10-CM | POA: Insufficient documentation

## 2023-06-15 DIAGNOSIS — A5402 Gonococcal vulvovaginitis, unspecified: Secondary | ICD-10-CM | POA: Insufficient documentation

## 2023-06-15 DIAGNOSIS — R1030 Lower abdominal pain, unspecified: Secondary | ICD-10-CM | POA: Insufficient documentation

## 2023-06-15 DIAGNOSIS — N898 Other specified noninflammatory disorders of vagina: Secondary | ICD-10-CM

## 2023-06-15 LAB — POCT URINALYSIS DIP (MANUAL ENTRY)
Bilirubin, UA: NEGATIVE
Blood, UA: NEGATIVE
Glucose, UA: NEGATIVE mg/dL
Ketones, POC UA: NEGATIVE mg/dL
Nitrite, UA: NEGATIVE
Protein Ur, POC: NEGATIVE mg/dL
Spec Grav, UA: >=1.03 — AB (ref 1.010–1.025)
Urobilinogen, UA: 0.2 U/dL
pH, UA: 6.5 (ref 5.0–8.0)

## 2023-06-15 LAB — POCT URINE PREGNANCY: Preg Test, Ur: NEGATIVE

## 2023-06-15 MED ORDER — METRONIDAZOLE 500 MG PO TABS: 500.0000 mg | ORAL_TABLET | Freq: Two times a day (BID) | ORAL | 0 refills | Status: DC

## 2023-06-15 NOTE — Discharge Instructions (Signed)
 We are treating you for bacterial vaginosis.  Start metronidazole  twice daily for 7 days.  Do not drink any alcohol while on this medication and for 3 days after completing course.  I will contact you if any to arrange additional treatment based on your swab and culture results.  Make sure you push fluids.  Follow-up with OB/GYN.  If anything worsens and you have pelvic pain, abdominal pain, fever, nausea, vomiting you need to be seen immediately.

## 2023-06-15 NOTE — ED Triage Notes (Signed)
 Low abd pain, "cramps", vaginal discharge x 2 weeks. + odor

## 2023-06-15 NOTE — ED Provider Notes (Signed)
 [facility]    CSN: [id number] Arrival date & time: 06/15/23  0857      History   Chief Complaint Chief Complaint  Patient presents with   Abdominal Pain    HPI Amanda Bright is a 28 y.o. female.   Patient presents today with a several week history of intermittent lower abdominal pain.  She describes this as cramping without identifiable trigger.  During episodes pain is rated 7 on a 0-10 pain scale, no aggravating or alleviating factors identified.  She has not been taking any over-the-counter medication.  She denies any urinary symptoms including frequency, urgency, hematuria.  She does report some vaginal discharge that has had an associated odor.  Denies history of STI or bacterial vaginosis.  She denies any changes to personal hygiene products including soaps or detergents.  She is no concern for pregnancy.  She is not currently on any kind of birth control.  Denies any recent antibiotics or steroids.  She does not take SGLT2 inhibitor.  She does not currently follow with OB/GYN.  Denies any fever, nausea, vomiting, diarrhea.    Past Medical History:  Diagnosis Date   Medical history non-contributory     Patient Active Problem List   Diagnosis Date Noted   Preeclampsia 01/11/2018   Pregnancy 01/11/2018   Supervision of normal first pregnancy, antepartum 09/07/2017    Past Surgical History:  Procedure Laterality Date   APPENDECTOMY     3rd grade    OB History     Gravida  1   Para  1   Term  1   Preterm  0   AB  0   Living  1      SAB  0   IAB  0   Ectopic  0   Multiple  0   Live Births  1            Home Medications    Prior to Admission medications   Medication Sig Start Date End Date Taking? Authorizing Provider  metroNIDAZOLE  (FLAGYL ) 500 MG tablet Take 1 tablet (500 mg total) by mouth 2 (two) times daily. 06/15/23  Yes Keaton Stirewalt K, PA-C  enalapril  (VASOTEC ) 10 MG tablet Take 1 tablet (10 mg total) by mouth  daily. Patient not taking: Reported on 03/09/2018 01/14/18   Wendelyn Halter, MD  ferrous sulfate  325 (65 FE) MG tablet Take 1 tablet (325 mg total) by mouth 3 (three) times daily with meals. 01/14/18   Wendelyn Halter, MD  ibuprofen  (ADVIL ,MOTRIN ) 600 MG tablet Take 1 tablet (600 mg total) by mouth every 6 (six) hours. 01/14/18   Wendelyn Halter, MD  prenatal vitamin w/FE, FA (PRENATAL 1 + 1) 27-1 MG TABS tablet Take 1 tablet by mouth daily at 12 noon.    [provider]    Family History Family History  Problem Relation Age of Onset   Alcohol abuse Neg Hx    Arthritis Neg Hx    Asthma Neg Hx    Birth defects Neg Hx    Cancer Neg Hx    COPD Neg Hx    Depression Neg Hx    Diabetes Neg Hx    Drug abuse Neg Hx    Early death Neg Hx    Hearing loss Neg Hx    Heart disease Neg Hx    Hyperlipidemia Neg Hx    Hypertension Neg Hx    Kidney disease Neg Hx    Learning disabilities Neg Hx  Mental illness Neg Hx    Mental retardation Neg Hx    Miscarriages / Stillbirths Neg Hx    Stroke Neg Hx    Vision loss Neg Hx    Varicose Veins Neg Hx     Social History Social History   Tobacco Use   Smoking status: Never   Smokeless tobacco: Never  Substance Use Topics   Alcohol use: Yes    Comment: social   Drug use: No     Allergies   Patient has no known allergies.   Review of Systems Review of Systems  Constitutional:  Positive for activity change. Negative for appetite change, fatigue and fever.  Respiratory:  Negative for cough and shortness of breath.   Cardiovascular:  Negative for chest pain.  Gastrointestinal:  Positive for abdominal pain. Negative for diarrhea, nausea and vomiting.  Genitourinary:  Positive for pelvic pain and vaginal discharge. Negative for dysuria, frequency, urgency, vaginal bleeding and vaginal pain.  Musculoskeletal:  Negative for arthralgias, back pain and myalgias.     Physical Exam Triage Vital Signs ED Triage Vitals  Encounter Vitals  Group     BP 06/15/23 1046 128/77     Systolic BP Percentile --      Diastolic BP Percentile --      Pulse Rate 06/15/23 1046 (!) 59     Resp 06/15/23 1046 20     Temp 06/15/23 1046 98.1 F (36.7 C)     Temp Source 06/15/23 1046 Oral     SpO2 06/15/23 1046 98 %     Weight --      Height --      Head Circumference --      Peak Flow --      Pain Score 06/15/23 1047 7     Pain Loc --      Pain Education --      Exclude from Growth Chart --    No data found.  Updated Vital Signs BP 128/77 (BP Location: Left Arm)   Pulse (!) 59   Temp 98.1 F (36.7 C) (Oral)   Resp 20   LMP 06/01/2023 (Approximate)   SpO2 98%   Visual Acuity Right Eye Distance:   Left Eye Distance:   Bilateral Distance:    Right Eye Near:   Left Eye Near:    Bilateral Near:     Physical Exam Vitals reviewed. Exam conducted with a chaperone present.  Constitutional:      General: She is awake. She is not in acute distress.    Appearance: Normal appearance. She is well-developed. She is not ill-appearing.     Comments: Very pleasant female appears stated age in no acute distress sitting comfortably in exam room  HENT:     Head: Normocephalic and atraumatic.  Cardiovascular:     Rate and Rhythm: Normal rate and regular rhythm.     Heart sounds: Normal heart sounds, S1 normal and S2 normal. No murmur heard. Pulmonary:     Effort: Pulmonary effort is normal.     Breath sounds: Normal breath sounds. No wheezing, rhonchi or rales.     Comments: Clear to auscultation bilaterally Abdominal:     General: Bowel sounds are normal.     Palpations: Abdomen is soft.     Tenderness: There is abdominal tenderness in the suprapubic area. There is no right CVA tenderness, left CVA tenderness, guarding or rebound. Negative signs include Rovsing's sign, McBurney's sign and psoas sign.  Genitourinary:    Labia:  Right: No rash or tenderness.        Left: No rash or tenderness.      Vagina: Vaginal discharge  present. No tenderness or bleeding.     Cervix: Normal.     Uterus: Normal.      Adnexa: Right adnexa normal and left adnexa normal.       Right: No mass or tenderness.         Left: No mass or tenderness.       Comments: Significant discharge noted posterior vaginal wall.  No significant tenderness with bimanual exam.  Sheryle Donning, RN present as chaperone during exam. Psychiatric:        Behavior: Behavior is cooperative.      UC Treatments / Results  Labs (all labs ordered are listed, but only abnormal results are displayed) Labs Reviewed  POCT URINALYSIS DIP (MANUAL ENTRY) - Abnormal; Notable for the following components:      Result Value   Clarity, UA hazy (*)    Spec Grav, UA >=1.030 (*)    Leukocytes, UA Trace (*)    All other components within normal limits  POCT URINE PREGNANCY - Normal  URINE CULTURE  CERVICOVAGINAL ANCILLARY ONLY    EKG   Radiology No results found.  Procedures Procedures (including critical care time)  Medications Ordered in UC Medications - No data to display  Initial Impression / Assessment and Plan / UC Course  I have reviewed the triage vital signs and the nursing notes.  Pertinent labs & imaging results that were available during my care of the patient were reviewed by me and considered in my medical decision making (see chart for details).     Patient is well-appearing, afebrile, nontoxic, nontachycardic.  Vital signs and physical exam are reassuring with no indication for emergent evaluation or imaging.  Urine pregnancy was negative.  UA was concentrated and did have some leukocyte esterase but we suspect this is related to vaginitis given her clinical presentation.  No indication of PID based on bimanual exam.  Will treat for bacterial vaginosis given her clinical presentation she was started on metronidazole  twice daily for 7 days with instruction not to drink any alcohol while on this medication and for 3 days after completing course.   She is to wear loosefitting cotton underwear and use hypoallergenic soaps and detergents.  STI swab was collected and is pending.  We will contact her if this is positive for anything else we need to arrange additional treatment.  Given she is having some suprapubic abdominal pain and had an abnormal UA, will send this for culture but defer antibiotics until culture results are available.  Recommend that she follow-up with OB/GYN if her symptoms are not improving and she was given the contact information for local provider with instruction to call to schedule an appointment.  If she has any worsening or changing symptoms including pelvic pain, abdominal pain, fever, nausea, vomiting she needs to be seen immediately.  Strict return precautions given.  All questions answered to patient satisfaction.  Final Clinical Impressions(s) / UC Diagnoses   Final diagnoses:  Lower abdominal pain  Vaginal discharge     Discharge Instructions      We are treating you for bacterial vaginosis.  Start metronidazole  twice daily for 7 days.  Do not drink any alcohol while on this medication and for 3 days after completing course.  I will contact you if any to arrange additional treatment based on your swab and culture results.  Make sure you push fluids.  Follow-up with OB/GYN.  If anything worsens and you have pelvic pain, abdominal pain, fever, nausea, vomiting you need to be seen immediately.     ED Prescriptions     Medication Sig Dispense Auth. Provider   metroNIDAZOLE  (FLAGYL ) 500 MG tablet Take 1 tablet (500 mg total) by mouth 2 (two) times daily. 14 tablet Alesa Echevarria K, PA-C      PDMP not reviewed this encounter.   Budd Cargo, PA-C 06/15/23 1140

## 2023-06-17 LAB — CERVICOVAGINAL ANCILLARY ONLY
Bacterial Vaginitis (gardnerella): POSITIVE — AB
Candida Glabrata: NEGATIVE
Candida Vaginitis: NEGATIVE
Chlamydia: POSITIVE — AB
Comment: NEGATIVE
Comment: NEGATIVE
Comment: NEGATIVE
Comment: NEGATIVE
Comment: NEGATIVE
Comment: NORMAL
Neisseria Gonorrhea: POSITIVE — AB
Trichomonas: NEGATIVE

## 2023-06-17 LAB — URINE CULTURE: Culture: NO GROWTH

## 2023-06-18 ENCOUNTER — Telehealth: Payer: Self-pay

## 2023-06-18 MED ORDER — DOXYCYCLINE HYCLATE 100 MG PO TABS: 100.0000 mg | ORAL_TABLET | Freq: Two times a day (BID) | ORAL | 0 refills | Status: AC

## 2023-06-18 NOTE — Telephone Encounter (Signed)
Per protocol, pt will need to return to UC for Nurse Visit for Rocephin 500mg  IM.  Per protocol, pt requires tx with Doxycycline.  Attempted to reach patient x1. Unable to LVM.  Rx sent to pharmacy on file.

## 2023-06-23 ENCOUNTER — Ambulatory Visit (HOSPITAL_BASED_OUTPATIENT_CLINIC_OR_DEPARTMENT_OTHER)
Admission: RE | Admit: 2023-06-23 | Discharge: 2023-06-23 | Disposition: A | Discharge status: Home / Self Care | Payer: Self-pay | Source: Ambulatory Visit | Attending: Family Medicine | Admitting: Family Medicine

## 2023-06-23 DIAGNOSIS — A549 Gonococcal infection, unspecified: Secondary | ICD-10-CM

## 2023-06-23 DIAGNOSIS — A749 Chlamydial infection, unspecified: Secondary | ICD-10-CM

## 2023-06-23 MED ORDER — CEFTRIAXONE SODIUM 1 G IJ SOLR
500.0000 mg | Freq: Once | INTRAMUSCULAR | Status: AC
  Administered 2023-06-23: 500 mg via INTRAMUSCULAR

## 2023-06-23 NOTE — ED Triage Notes (Signed)
Pt returns to U/C for a Rocephin shot due to positive Gonorrhea test.

## 2023-07-27 ENCOUNTER — Ambulatory Visit (HOSPITAL_BASED_OUTPATIENT_CLINIC_OR_DEPARTMENT_OTHER): Payer: Self-pay

## 2023-07-29 ENCOUNTER — Encounter (HOSPITAL_BASED_OUTPATIENT_CLINIC_OR_DEPARTMENT_OTHER): Payer: Self-pay

## 2023-07-29 ENCOUNTER — Ambulatory Visit (HOSPITAL_BASED_OUTPATIENT_CLINIC_OR_DEPARTMENT_OTHER)
Admission: RE | Admit: 2023-07-29 | Discharge: 2023-07-29 | Disposition: A | Discharge status: Home / Self Care | Payer: Self-pay | Source: Ambulatory Visit | Attending: Family Medicine | Admitting: Family Medicine

## 2023-07-29 ENCOUNTER — Other Ambulatory Visit (HOSPITAL_BASED_OUTPATIENT_CLINIC_OR_DEPARTMENT_OTHER): Payer: Self-pay

## 2023-07-29 VITALS — BP 115/67 | HR 84 | Temp 98.8°F | Resp 18

## 2023-07-29 DIAGNOSIS — R1032 Left lower quadrant pain: Secondary | ICD-10-CM

## 2023-07-29 DIAGNOSIS — R109 Unspecified abdominal pain: Secondary | ICD-10-CM

## 2023-07-29 DIAGNOSIS — N72 Inflammatory disease of cervix uteri: Secondary | ICD-10-CM

## 2023-07-29 DIAGNOSIS — N898 Other specified noninflammatory disorders of vagina: Secondary | ICD-10-CM

## 2023-07-29 DIAGNOSIS — R1031 Right lower quadrant pain: Secondary | ICD-10-CM

## 2023-07-29 DIAGNOSIS — Z8619 Personal history of other infectious and parasitic diseases: Secondary | ICD-10-CM

## 2023-07-29 MED ORDER — CEFTRIAXONE SODIUM 1 G IJ SOLR
500.0000 mg | INTRAMUSCULAR | Status: DC
  Administered 2023-07-29: 500 mg via INTRAMUSCULAR

## 2023-07-29 MED ORDER — AZITHROMYCIN 250 MG PO TABS
1000.0000 mg | ORAL_TABLET | ORAL | 0 refills | Status: DC
  Filled 2023-07-29: qty 6, 1d supply, fill #0

## 2023-07-29 MED ORDER — AZITHROMYCIN 250 MG PO TABS
1000.0000 mg | ORAL_TABLET | ORAL | 0 refills | Status: AC
  Filled 2023-07-29: qty 4, 1d supply, fill #0

## 2023-07-29 NOTE — ED Triage Notes (Signed)
 Pt has been having abdominal cramp that started last Friday, pt reports vaginal clear discharge after she has the pain.

## 2023-07-29 NOTE — ED Provider Notes (Signed)
 [facility]    CSN: [id number] Arrival date & time: 07/29/23  4098      History   Chief Complaint Chief Complaint  Patient presents with   Abdominal Pain    Entered by patient    HPI Amanda Bright is a 28 y.o. female.   The patient was seen on 06/15/2023 at this urgent care with vaginitis symptoms.  She ended up positive for gonorrhea, chlamydia, bacterial vaginosis.  She was treated with a Rocephin injection, doxycycline, metronidazole.  She was given the metronidazole on 06/15/2023 and started on the doxycycline on 06/18/2023 when her STI swab resulted.  She reports that the doxycycline gave her significant stomach upset and she did not complete it.  She is now having intermittent lower abdominal cramps and has some vaginal discharge that is clear at times after the abdominal cramping.  She denies urinary frequency nor urgency.  She denies any kind of odor to her vaginal discharge.  She reports she has not had any sexual partner since she was notified of her test results.   Abdominal Pain Associated symptoms: vaginal discharge   Associated symptoms: no chest pain, no chills, no constipation, no cough, no diarrhea, no dysuria, no fever, no hematuria, no nausea, no shortness of breath, no sore throat and no vomiting     Past Medical History:  Diagnosis Date   Medical history non-contributory     Patient Active Problem List   Diagnosis Date Noted   Preeclampsia 01/11/2018   Pregnancy 01/11/2018   Supervision of normal first pregnancy, antepartum 09/07/2017    Past Surgical History:  Procedure Laterality Date   APPENDECTOMY     3rd grade    OB History     Gravida  1   Para  1   Term  1   Preterm  0   AB  0   Living  1      SAB  0   IAB  0   Ectopic  0   Multiple  0   Live Births  1            Home Medications    Prior to Admission medications   Medication Sig Start Date End Date Taking? Authorizing Provider  azithromycin  (ZITHROMAX) 250 MG tablet Take 4 tablets (1,000 mg total) by mouth now for 1 dose. 07/29/23 07/30/23  Prescilla Sours, FNP    Family History Family History  Problem Relation Age of Onset   Alcohol abuse Neg Hx    Arthritis Neg Hx    Asthma Neg Hx    Birth defects Neg Hx    Cancer Neg Hx    COPD Neg Hx    Depression Neg Hx    Diabetes Neg Hx    Drug abuse Neg Hx    Early death Neg Hx    Hearing loss Neg Hx    Heart disease Neg Hx    Hyperlipidemia Neg Hx    Hypertension Neg Hx    Kidney disease Neg Hx    Learning disabilities Neg Hx    Mental illness Neg Hx    Mental retardation Neg Hx    Miscarriages / Stillbirths Neg Hx    Stroke Neg Hx    Vision loss Neg Hx    Varicose Veins Neg Hx     Social History Social History   Tobacco Use   Smoking status: Never   Smokeless tobacco: Never  Substance Use Topics   Alcohol use: Yes  Comment: social   Drug use: No     Allergies   Patient has no known allergies.   Review of Systems Review of Systems  Constitutional:  Negative for chills and fever.  HENT:  Negative for ear pain and sore throat.   Eyes:  Negative for pain and visual disturbance.  Respiratory:  Negative for cough and shortness of breath.   Cardiovascular:  Negative for chest pain and palpitations.  Gastrointestinal:  Positive for abdominal pain. Negative for constipation, diarrhea, nausea and vomiting.  Genitourinary:  Positive for vaginal discharge. Negative for dysuria and hematuria.  Musculoskeletal:  Negative for arthralgias and back pain.  Skin:  Negative for color change and rash.  Neurological:  Negative for seizures and syncope.  All other systems reviewed and are negative.    Physical Exam Triage Vital Signs ED Triage Vitals  Encounter Vitals Group     BP 07/29/23 0902 115/67     Systolic BP Percentile --      Diastolic BP Percentile --      Pulse Rate 07/29/23 0902 84     Resp 07/29/23 0902 18     Temp 07/29/23 0902 98.8 F (37.1 C)      Temp Source 07/29/23 0902 Oral     SpO2 07/29/23 0902 97 %     Weight --      Height --      Head Circumference --      Peak Flow --      Pain Score 07/29/23 0901 5     Pain Loc --      Pain Education --      Exclude from Growth Chart --    No data found.  Updated Vital Signs BP 115/67 (BP Location: Right Arm)   Pulse 84   Temp 98.8 F (37.1 C) (Oral)   Resp 18   LMP 07/06/2023 (Approximate)   SpO2 97%   Visual Acuity Right Eye Distance:   Left Eye Distance:   Bilateral Distance:    Right Eye Near:   Left Eye Near:    Bilateral Near:     Physical Exam Vitals and nursing note reviewed. Exam conducted with a chaperone present Izola Price, Charity fundraiser).  Constitutional:      General: She is not in acute distress.    Appearance: She is well-developed. She is not ill-appearing or toxic-appearing.  HENT:     Head: Normocephalic and atraumatic.     Right Ear: Hearing, tympanic membrane, ear canal and external ear normal.     Left Ear: Hearing, tympanic membrane, ear canal and external ear normal.     Nose: No congestion or rhinorrhea.     Right Sinus: No maxillary sinus tenderness or frontal sinus tenderness.     Left Sinus: No maxillary sinus tenderness or frontal sinus tenderness.     Mouth/Throat:     Lips: Pink.     Mouth: Mucous membranes are moist.     Pharynx: Uvula midline. No oropharyngeal exudate or posterior oropharyngeal erythema.     Tonsils: No tonsillar exudate.  Eyes:     Conjunctiva/sclera: Conjunctivae normal.     Pupils: Pupils are equal, round, and reactive to light.  Cardiovascular:     Rate and Rhythm: Normal rate and regular rhythm.     Heart sounds: S1 normal and S2 normal. No murmur heard. Pulmonary:     Effort: Pulmonary effort is normal. No respiratory distress.     Breath sounds: Normal breath sounds. No decreased  breath sounds, wheezing, rhonchi or rales.  Abdominal:     General: Bowel sounds are normal.     Palpations: Abdomen is soft.      Tenderness: There is abdominal tenderness (mild) in the right lower quadrant, suprapubic area and left lower quadrant.     Hernia: There is no hernia in the left inguinal area or right inguinal area.  Genitourinary:    Exam position: Lithotomy position.     Labia:        Right: No rash, tenderness, lesion or injury.        Left: No rash, tenderness, lesion or injury.      Urethra: No prolapse, urethral pain, urethral swelling or urethral lesion.     Vagina: Normal.     Cervix: Discharge (Thick, greenish mucoid discharge) and erythema present. No friability, lesion or cervical bleeding.     Uterus: Normal.      Adnexa: Right adnexa normal.       Left: Tenderness present.   Musculoskeletal:        General: No swelling.     Cervical back: Neck supple.  Lymphadenopathy:     Head:     Right side of head: No submental, submandibular, tonsillar, preauricular or posterior auricular adenopathy.     Left side of head: No submental, submandibular, tonsillar, preauricular or posterior auricular adenopathy.     Cervical: No cervical adenopathy.     Right cervical: No superficial cervical adenopathy.    Left cervical: No superficial cervical adenopathy.     Lower Body: No right inguinal adenopathy. No left inguinal adenopathy.  Skin:    General: Skin is warm and dry.     Capillary Refill: Capillary refill takes less than 2 seconds.     Findings: No rash.  Neurological:     Mental Status: She is alert and oriented to person, place, and time.  Psychiatric:        Mood and Affect: Mood normal.      UC Treatments / Results  Labs (all labs ordered are listed, but only abnormal results are displayed) Labs Reviewed  CERVICOVAGINAL ANCILLARY ONLY    Cervicovaginal ancillary only: 06/15/23:     Component Ref Range & Units (hover) 1 mo ago  Neisseria Gonorrhea Positive Abnormal   Chlamydia Positive Abnormal   Trichomonas Negative  Bacterial Vaginitis (gardnerella) Positive Abnormal    Candida Vaginitis Negative  Candida Glabrata Negative  Comment Normal Reference Range Bacterial Vaginosis - Negative  Comment Normal Reference Range Candida Species - Negative  Comment Normal Reference Range Candida Galbrata - Negative  Comment Normal Reference Range Trichomonas - Negative  Comment Normal Reference Ranger Chlamydia - Negative  Comment Normal Reference Range Neisseria Gonorrhea - Negative  Resulting Agency [facility]       EKG   Radiology No results found.  Procedures Procedures (including critical care time)  Medications Ordered in UC Medications  cefTRIAXone (ROCEPHIN) injection 500 mg (500 mg Intramuscular Given 07/29/23 0951)    Initial Impression / Assessment and Plan / UC Course  I have reviewed the triage vital signs and the nursing notes.  Pertinent labs & imaging results that were available during my care of the patient were reviewed by me and considered in my medical decision making (see chart for details).     Given Rocephin, 500 mg IM now.  Treated with azithromycin, 250 mg, #4, now.  Educated about cervicitis.  STI swab sent.  Will adjust the plan of care, if needed  once the swab results.  Discussed sexual activity and use of condoms and she is abstaining for now.  Follow-up if symptoms do not improve, worsen or new symptoms occur.  Final Clinical Impressions(s) / UC Diagnoses   Final diagnoses:  Right lower quadrant abdominal pain  Left lower quadrant abdominal pain  Abdominal cramping  History of gonorrhea  History of chlamydia  Vaginal discharge  Cervicitis     Discharge Instructions      Exam shows cervicitis or a vaginal infection.  Will treat with ceftriaxone 500 mg injection in the office.  Azithromycin, 250 mg number 4 pills at 1 time.  Get plenty of fluids and rest.  STI swab collected and sent.  Follow-up if symptoms do not improve, worsen or new symptoms occur.     ED Prescriptions     Medication Sig Dispense Auth.  Provider   azithromycin (ZITHROMAX) 250 MG tablet  (Status: Discontinued) Take 4 tablets (1,000 mg total) by mouth now for 1 dose. 6 tablet Prescilla Sours, FNP   azithromycin (ZITHROMAX) 250 MG tablet Take 4 tablets (1,000 mg total) by mouth now for 1 dose. 4 tablet Prescilla Sours, FNP      PDMP not reviewed this encounter.   Prescilla Sours, FNP 07/29/23 1000

## 2023-07-29 NOTE — Discharge Instructions (Addendum)
 Exam shows cervicitis or a vaginal infection.  Will treat with ceftriaxone 500 mg injection in the office.  Azithromycin, 250 mg number 4 pills at 1 time.  Get plenty of fluids and rest.  STI swab collected and sent.  Follow-up if symptoms do not improve, worsen or new symptoms occur.

## 2023-07-30 LAB — CERVICOVAGINAL ANCILLARY ONLY
Bacterial Vaginitis (gardnerella): NEGATIVE
Candida Glabrata: NEGATIVE
Candida Vaginitis: NEGATIVE
Chlamydia: NEGATIVE
Comment: NEGATIVE
Comment: NEGATIVE
Comment: NEGATIVE
Comment: NEGATIVE
Comment: NEGATIVE
Comment: NORMAL
Neisseria Gonorrhea: NEGATIVE
Trichomonas: NEGATIVE

## 2023-07-30 NOTE — Progress Notes (Signed)
 STI testing was all negative.  Patient updated via VM message.

## 2023-08-29 ENCOUNTER — Encounter (HOSPITAL_BASED_OUTPATIENT_CLINIC_OR_DEPARTMENT_OTHER): Payer: Self-pay | Admitting: Emergency Medicine

## 2023-08-29 ENCOUNTER — Ambulatory Visit (HOSPITAL_BASED_OUTPATIENT_CLINIC_OR_DEPARTMENT_OTHER)
Admission: EM | Admit: 2023-08-29 | Discharge: 2023-08-29 | Disposition: A | Discharge status: Home / Self Care | Payer: Self-pay | Attending: Family Medicine | Admitting: Family Medicine

## 2023-08-29 DIAGNOSIS — J020 Streptococcal pharyngitis: Secondary | ICD-10-CM

## 2023-08-29 LAB — POCT RAPID STREP A (OFFICE): Rapid Strep A Screen: POSITIVE — AB

## 2023-08-29 MED ORDER — AMOXICILLIN 500 MG PO CAPS: 1000.0000 mg | ORAL_CAPSULE | Freq: Every day | ORAL | 0 refills | Status: AC

## 2023-08-29 NOTE — Discharge Instructions (Signed)
 Treating you for strep throat. Medication as prescribed.  You can take tylenol  and motrin  for the pain.  Warm salt water to the throat.  Follow up as needed.

## 2023-08-29 NOTE — ED Provider Notes (Signed)
 [facility]    CSN: [id number] Arrival date & time: 08/29/23  1124      History   Chief Complaint Chief Complaint  Patient presents with   Cough   Sore Throat    HPI Amanda Bright is a 28 y.o. female.   Patient is a 28 year old female who presents today with cough, congestion, sore throat for approximately 1 week.  Sore throat has worsened and she feels like something is stuck in her throat.  She is having pain with swallowing.  Denies any fever, chills.   Cough Sore Throat    Past Medical History:  Diagnosis Date   Medical history non-contributory     Patient Active Problem List   Diagnosis Date Noted   Preeclampsia 01/11/2018   Pregnancy 01/11/2018   Supervision of normal first pregnancy, antepartum 09/07/2017    Past Surgical History:  Procedure Laterality Date   APPENDECTOMY     3rd grade    OB History     Gravida  1   Para  1   Term  1   Preterm  0   AB  0   Living  1      SAB  0   IAB  0   Ectopic  0   Multiple  0   Live Births  1            Home Medications    Prior to Admission medications   Medication Sig Start Date End Date Taking? Authorizing Provider  amoxicillin (AMOXIL) 500 MG capsule Take 2 capsules (1,000 mg total) by mouth daily for 10 days. 08/29/23 09/08/23 Yes Tevion Laforge, Verdel Gitelman, FNP    Family History Family History  Problem Relation Age of Onset   Alcohol abuse Neg Hx    Arthritis Neg Hx    Asthma Neg Hx    Birth defects Neg Hx    Cancer Neg Hx    COPD Neg Hx    Depression Neg Hx    Diabetes Neg Hx    Drug abuse Neg Hx    Early death Neg Hx    Hearing loss Neg Hx    Heart disease Neg Hx    Hyperlipidemia Neg Hx    Hypertension Neg Hx    Kidney disease Neg Hx    Learning disabilities Neg Hx    Mental illness Neg Hx    Mental retardation Neg Hx    Miscarriages / Stillbirths Neg Hx    Stroke Neg Hx    Vision loss Neg Hx    Varicose Veins Neg Hx     Social History Social History    Tobacco Use   Smoking status: Never   Smokeless tobacco: Never  Substance Use Topics   Alcohol use: Yes    Comment: social   Drug use: No     Allergies   Patient has no known allergies.   Review of Systems Review of Systems  Respiratory:  Positive for cough.    See HPI  Physical Exam Triage Vital Signs ED Triage Vitals  Encounter Vitals Group     BP 08/29/23 1206 117/78     Systolic BP Percentile --      Diastolic BP Percentile --      Pulse Rate 08/29/23 1206 62     Resp 08/29/23 1206 18     Temp 08/29/23 1206 98 F (36.7 C)     Temp Source 08/29/23 1206 Oral     SpO2 08/29/23 1206 98 %  Weight --      Height --      Head Circumference --      Peak Flow --      Pain Score 08/29/23 1205 0     Pain Loc --      Pain Education --      Exclude from Growth Chart --    No data found.  Updated Vital Signs BP 117/78 (BP Location: Right Arm)   Pulse 62   Temp 98 F (36.7 C) (Oral)   Resp 18   LMP 07/27/2023 (Approximate)   SpO2 98%   Visual Acuity Right Eye Distance:   Left Eye Distance:   Bilateral Distance:    Right Eye Near:   Left Eye Near:    Bilateral Near:     Physical Exam Vitals and nursing note reviewed.  Constitutional:      General: She is not in acute distress.    Appearance: Normal appearance. She is not ill-appearing, toxic-appearing or diaphoretic.  HENT:     Mouth/Throat:     Pharynx: Uvula midline. Pharyngeal swelling and posterior oropharyngeal erythema present.     Tonsils: Tonsillar exudate present. 3+ on the right. 3+ on the left.  Pulmonary:     Effort: Pulmonary effort is normal.  Neurological:     Mental Status: She is alert.  Psychiatric:        Mood and Affect: Mood normal.      UC Treatments / Results  Labs (all labs ordered are listed, but only abnormal results are displayed) Labs Reviewed  POCT RAPID STREP A (OFFICE) - Abnormal; Notable for the following components:      Result Value   Rapid Strep A  Screen Positive (*)    All other components within normal limits    EKG   Radiology No results found.  Procedures Procedures (including critical care time)  Medications Ordered in UC Medications - No data to display  Initial Impression / Assessment and Plan / UC Course  I have reviewed the triage vital signs and the nursing notes.  Pertinent labs & imaging results that were available during my care of the patient were reviewed by me and considered in my medical decision making (see chart for details).     Strep throat- strep test positive. Treating with amoxicillin. Tylenol  and Motrin  for pain as needed. Warm salt water gargles to the throat. Follow up as needed.  Final Clinical Impressions(s) / UC Diagnoses   Final diagnoses:  Streptococcal sore throat     Discharge Instructions      Treating you for strep throat. Medication as prescribed.  You can take tylenol  and motrin  for the pain.  Warm salt water to the throat.  Follow up as needed.     ED Prescriptions     Medication Sig Dispense Auth. Provider   amoxicillin (AMOXIL) 500 MG capsule Take 2 capsules (1,000 mg total) by mouth daily for 10 days. 20 capsule Landa Pine, FNP      PDMP not reviewed this encounter.   Landa Pine, FNP 08/29/23 1335

## 2023-08-29 NOTE — ED Triage Notes (Signed)
 Pt c/o coughing, sore throat, pt also feels like something is stuck in her throat x 1 week.

## 2023-11-18 ENCOUNTER — Encounter (HOSPITAL_BASED_OUTPATIENT_CLINIC_OR_DEPARTMENT_OTHER): Payer: Self-pay | Admitting: Emergency Medicine

## 2023-11-18 ENCOUNTER — Ambulatory Visit (HOSPITAL_BASED_OUTPATIENT_CLINIC_OR_DEPARTMENT_OTHER)
Admission: EM | Admit: 2023-11-18 | Discharge: 2023-11-18 | Disposition: A | Discharge status: Home / Self Care | Payer: Self-pay | Attending: Family Medicine | Admitting: Family Medicine

## 2023-11-18 DIAGNOSIS — M25571 Pain in right ankle and joints of right foot: Secondary | ICD-10-CM | POA: Insufficient documentation

## 2023-11-18 DIAGNOSIS — M5441 Lumbago with sciatica, right side: Secondary | ICD-10-CM | POA: Insufficient documentation

## 2023-11-18 DIAGNOSIS — N72 Inflammatory disease of cervix uteri: Secondary | ICD-10-CM | POA: Insufficient documentation

## 2023-11-18 DIAGNOSIS — N760 Acute vaginitis: Secondary | ICD-10-CM | POA: Insufficient documentation

## 2023-11-18 DIAGNOSIS — M546 Pain in thoracic spine: Secondary | ICD-10-CM | POA: Insufficient documentation

## 2023-11-18 MED ORDER — CEFTRIAXONE SODIUM 500 MG IJ SOLR
500.0000 mg | INTRAMUSCULAR | Status: DC
  Administered 2023-11-18: 500 mg via INTRAMUSCULAR

## 2023-11-18 MED ORDER — AZITHROMYCIN 250 MG PO TABS: ORAL_TABLET | ORAL | 0 refills | Status: DC

## 2023-11-18 MED ORDER — CYCLOBENZAPRINE HCL 5 MG PO TABS: 5.0000 mg | ORAL_TABLET | Freq: Every evening | ORAL | 0 refills | Status: AC | PRN

## 2023-11-18 MED ORDER — CELECOXIB 200 MG PO CAPS: 200.0000 mg | ORAL_CAPSULE | Freq: Two times a day (BID) | ORAL | 0 refills | Status: AC | PRN

## 2023-11-18 NOTE — ED Triage Notes (Signed)
 Pt c/o right side pain from right mid abdomen/flank area down her leg and in the morning her right ankle is swollen, she also will have vaginal discharge every other day.

## 2023-11-18 NOTE — Discharge Instructions (Addendum)
 Cervicitis and vaginitis: STI swab collected.  Will adjust the plan of care, if needed once the STI swab results.  Due to the cervical discharge, will treat with azithromycin , 250 mg, 4 pills now.  And ceftriaxone , 500 mg, 1 injection now.  Encouraged safe sex in the future.  Patient will wait for results of testing before she contacts her partner.  Back pain with sciatica down right leg and right ankle pain: Celebrex  200 mg 1 pill twice daily if needed for back pain.  May take Celebrex  and drive or work.  Cyclobenzaprine , 5 mg, 1 pill nightly for muscle spasm.  Do not take cyclobenzaprine  and drive.  May take cyclobenzaprine  and Celebrex  together at night, if needed.  Encouraged heat or ice therapy to back as needed.  Encouraged gentle back exercises.  Follow-up if symptoms do not improve, worsen or new symptoms occur.

## 2023-11-18 NOTE — ED Provider Notes (Signed)
 [facility]    CSN: [id number] Arrival date & time: 11/18/23  1735      History   Chief Complaint No chief complaint on file.   HPI Amanda Bright is a 28 y.o. female.   28 year old female who reports new onset right mid and lower back pain that shoots down her right leg and even her hurts in her right ankle.  In the morning she feels like her ankle is stiff and swollen and she has to wait a minute before she can stand up and walk on it.  She is not aware of any kind of injury or activity that might of caused the back pain or the leg or ankle pain.  The back pain symptoms have been present since 11/03/2023 or earlier.    She reports that she has some intermittent mild mucus-like vaginal discharge.  Vaginal discharge is not daily.  She has had the discharge off and on since mid June 2025.  Patient reports that she has not been sexually active in approximately a month.  She is not currently with that partner but at the time she was not using condoms and she had relations with them.       Past Medical History:  Diagnosis Date   Medical history non-contributory     Patient Active Problem List   Diagnosis Date Noted   Preeclampsia 01/11/2018   Pregnancy 01/11/2018   Supervision of normal first pregnancy, antepartum 09/07/2017    Past Surgical History:  Procedure Laterality Date   APPENDECTOMY     3rd grade    OB History     Gravida  1   Para  1   Term  1   Preterm  0   AB  0   Living  1      SAB  0   IAB  0   Ectopic  0   Multiple  0   Live Births  1            Home Medications    Prior to Admission medications   Medication Sig Start Date End Date Taking? Authorizing Provider  azithromycin  (ZITHROMAX ) 250 MG tablet 4 pills today for complete dose. 11/18/23  Yes Ival Domino, FNP  celecoxib  (CELEBREX ) 200 MG capsule Take 1 capsule (200 mg total) by mouth every 12 (twelve) hours as needed for up to 15 days (take with food for back  pain, if needed). 11/18/23 12/03/23 Yes Ival Domino, FNP  cyclobenzaprine  (FLEXERIL ) 5 MG tablet Take 1 tablet (5 mg total) by mouth at bedtime as needed for up to 15 days for muscle spasms. 11/18/23 12/03/23 Yes Ival Domino, FNP    Family History Family History  Problem Relation Age of Onset   Alcohol abuse Neg Hx    Arthritis Neg Hx    Asthma Neg Hx    Birth defects Neg Hx    Cancer Neg Hx    COPD Neg Hx    Depression Neg Hx    Diabetes Neg Hx    Drug abuse Neg Hx    Early death Neg Hx    Hearing loss Neg Hx    Heart disease Neg Hx    Hyperlipidemia Neg Hx    Hypertension Neg Hx    Kidney disease Neg Hx    Learning disabilities Neg Hx    Mental illness Neg Hx    Mental retardation Neg Hx    Miscarriages / Stillbirths Neg Hx    Stroke Neg  Hx    Vision loss Neg Hx    Varicose Veins Neg Hx     Social History Social History   Tobacco Use   Smoking status: Never   Smokeless tobacco: Never  Substance Use Topics   Alcohol use: Yes    Comment: social   Drug use: No     Allergies   Patient has no known allergies.   Review of Systems Review of Systems  Constitutional:  Negative for fever.  Respiratory:  Negative for cough.   Cardiovascular:  Negative for chest pain.  Gastrointestinal:  Negative for abdominal pain, constipation, diarrhea, nausea and vomiting.  Genitourinary:  Positive for vaginal discharge.  Musculoskeletal:  Positive for arthralgias (Right ankle pain and sometimes some right ankle swelling), back pain (Right mid back and right lower back pain that shoots down the right leg to the foot and ankle) and joint swelling (Right ankle pain and sometimes some right ankle swelling.).  Skin:  Negative for color change and rash.  Neurological:  Negative for syncope.  All other systems reviewed and are negative.    Physical Exam Triage Vital Signs ED Triage Vitals  Encounter Vitals Group     BP 11/18/23 1810 115/75     Girls Systolic BP Percentile --       Girls Diastolic BP Percentile --      Boys Systolic BP Percentile --      Boys Diastolic BP Percentile --      Pulse Rate 11/18/23 1810 61     Resp 11/18/23 1810 18     Temp 11/18/23 1810 98.6 F (37 C)     Temp Source 11/18/23 1810 Oral     SpO2 11/18/23 1810 99 %     Weight --      Height --      Head Circumference --      Peak Flow --      Pain Score 11/18/23 1808 6     Pain Loc --      Pain Education --      Exclude from Growth Chart --    No data found.  Updated Vital Signs BP 115/75 (BP Location: Right Arm)   Pulse 61   Temp 98.6 F (37 C) (Oral)   Resp 18   LMP 11/04/2023   SpO2 99%   Visual Acuity Right Eye Distance:   Left Eye Distance:   Bilateral Distance:    Right Eye Near:   Left Eye Near:    Bilateral Near:     Physical Exam Vitals and nursing note reviewed. Exam conducted with a chaperone present Johnnie Plana, Char[employer] fundraiser).  Constitutional:      General: She is not in acute distress.    Appearance: She is well-developed. She is not ill-appearing or toxic-appearing.  HENT:     Head: Normocephalic and atraumatic.     Right Ear: Hearing, tympanic membrane, ear canal and external ear normal.     Left Ear: Hearing, tympanic membrane, ear canal and external ear normal.     Nose: No congestion or rhinorrhea.     Right Sinus: No maxillary sinus tenderness or frontal sinus tenderness.     Left Sinus: No maxillary sinus tenderness or frontal sinus tenderness.     Mouth/Throat:     Lips: Pink.     Mouth: Mucous membranes are moist.     Pharynx: Uvula midline. No oropharyngeal exudate or posterior oropharyngeal erythema.     Tonsils: No tonsillar exudate.  Eyes:  Conjunctiva/sclera: Conjunctivae normal.     Pupils: Pupils are equal, round, and reactive to light.  Cardiovascular:     Rate and Rhythm: Normal rate and regular rhythm.     Heart sounds: S1 normal and S2 normal. No murmur heard. Pulmonary:     Effort: Pulmonary effort is normal. No respiratory  distress.     Breath sounds: Normal breath sounds. No decreased breath sounds, wheezing, rhonchi or rales.  Abdominal:     General: Bowel sounds are normal.     Palpations: Abdomen is soft.     Tenderness: There is abdominal tenderness in the epigastric area and suprapubic area.     Hernia: There is no hernia in the left inguinal area or right inguinal area.  Genitourinary:    Exam position: Lithotomy position.     Pubic Area: No rash or pubic lice.      Labia:        Right: No rash, tenderness, lesion or injury.        Left: No rash, tenderness, lesion or injury.      Urethra: No prolapse, urethral pain, urethral swelling or urethral lesion.     Vagina: No signs of injury and foreign body. Vaginal discharge (Yellow-greenish discharge) and tenderness (Mild) present. No erythema, bleeding, lesions or prolapsed vaginal walls.     Cervix: Discharge (Some yellowish-green discharge) and erythema (Minimal erythema of the cervix) present. No cervical motion tenderness, friability, lesion, cervical bleeding or eversion.     Uterus: Not deviated, not enlarged, not fixed, not tender and no uterine prolapse.      Adnexa:        Right: No mass, tenderness or fullness.         Left: No mass, tenderness or fullness.    Musculoskeletal:        General: No swelling.     Cervical back: Normal and neck supple.     Thoracic back: Tenderness (T5-T10 and laterally to the right side.) present. No swelling, edema, deformity, signs of trauma, lacerations, spasms or bony tenderness. Normal range of motion. No scoliosis.     Lumbar back: Tenderness (L1-L5 with referred pain to the right hip and right leg) present. No swelling, edema, deformity, signs of trauma, lacerations, spasms or bony tenderness. Normal range of motion. No scoliosis.  Lymphadenopathy:     Head:     Right side of head: No submental, submandibular, tonsillar, preauricular or posterior auricular adenopathy.     Left side of head: No submental,  submandibular, tonsillar, preauricular or posterior auricular adenopathy.     Cervical: No cervical adenopathy.     Right cervical: No superficial cervical adenopathy.    Left cervical: No superficial cervical adenopathy.     Lower Body: No right inguinal adenopathy. No left inguinal adenopathy.  Skin:    General: Skin is warm and dry.     Capillary Refill: Capillary refill takes less than 2 seconds.     Findings: No rash.  Neurological:     Mental Status: She is alert and oriented to person, place, and time.     Deep Tendon Reflexes: Reflexes are normal and symmetric.  Psychiatric:        Mood and Affect: Mood normal.      UC Treatments / Results  Labs (all labs ordered are listed, but only abnormal results are displayed) Labs Reviewed  CERVICOVAGINAL ANCILLARY ONLY    EKG   Radiology No results found.  Procedures Procedures (including critical care time)  Medications Ordered in UC Medications  cefTRIAXone  (ROCEPHIN ) injection 500 mg (500 mg Intramuscular Given 11/18/23 1900)    Initial Impression / Assessment and Plan / UC Course  I have reviewed the triage vital signs and the nursing notes.  Pertinent labs & imaging results that were available during my care of the patient were reviewed by me and considered in my medical decision making (see chart for details).  Plan of Care: Cervicitis: STI swab pending.  Will adjust the plan of care, if needed once the swab results.  Azithromycin  1000 mg orally today.  Rocephin  or ceftriaxone  500 mg injection today.  Patient is also aware she can check the portal for results.  She will only get called if she needs additional therapy.  Back pain with sciatica and ankle pain: Celebrex  200 mg 1 pill twice daily as needed for back pain.  Take the Celebrex  with food.  Cyclobenzaprine  5 mg nightly for muscle spasm.  Do not use the cyclobenzaprine  and drive.  May take the Celebrex  and cyclobenzaprine  together if needed.  Follow-up if  symptoms do not improve, worsen or new symptoms occur.  See discharge instructions for more information.  I reviewed the plan of care with the patient and/or the patient's guardian.  The patient and/or guardian had time to ask questions and acknowledged that the questions were answered.  I provided instruction on symptoms or reasons to return here or to go to an ER, if symptoms/condition did not improve, worsened or if new symptoms occurred.  Final Clinical Impressions(s) / UC Diagnoses   Final diagnoses:  Cervicitis  Vaginitis and vulvovaginitis  Acute right-sided thoracic back pain  Acute right-sided low back pain with right-sided sciatica  Acute right ankle pain     Discharge Instructions      Cervicitis and vaginitis: STI swab collected.  Will adjust the plan of care, if needed once the STI swab results.  Due to the cervical discharge, will treat with azithromycin , 250 mg, 4 pills now.  And ceftriaxone , 500 mg, 1 injection now.  Encouraged safe sex in the future.  Patient will wait for results of testing before she contacts her partner.  Back pain with sciatica down right leg and right ankle pain: Celebrex  200 mg 1 pill twice daily if needed for back pain.  May take Celebrex  and drive or work.  Cyclobenzaprine , 5 mg, 1 pill nightly for muscle spasm.  Do not take cyclobenzaprine  and drive.  May take cyclobenzaprine  and Celebrex  together at night, if needed.  Encouraged heat or ice therapy to back as needed.  Encouraged gentle back exercises.  Follow-up if symptoms do not improve, worsen or new symptoms occur.     ED Prescriptions     Medication Sig Dispense Auth. Provider   azithromycin  (ZITHROMAX ) 250 MG tablet 4 pills today for complete dose. 4 tablet Montgomery Favor, FNP   celecoxib  (CELEBREX ) 200 MG capsule Take 1 capsule (200 mg total) by mouth every 12 (twelve) hours as needed for up to 15 days (take with food for back pain, if needed). 30 capsule Ival Domino, FNP    cyclobenzaprine  (FLEXERIL ) 5 MG tablet Take 1 tablet (5 mg total) by mouth at bedtime as needed for up to 15 days for muscle spasms. 15 tablet Reniya Mcclees, FNP      PDMP not reviewed this encounter.   Ival Domino, FNP 11/18/23 [phone]

## 2023-11-19 ENCOUNTER — Ambulatory Visit (HOSPITAL_BASED_OUTPATIENT_CLINIC_OR_DEPARTMENT_OTHER): Payer: Self-pay | Admitting: Family Medicine

## 2023-11-19 LAB — CERVICOVAGINAL ANCILLARY ONLY
Bacterial Vaginitis (gardnerella): NEGATIVE
Candida Glabrata: NEGATIVE
Candida Vaginitis: NEGATIVE
Chlamydia: NEGATIVE
Comment: NEGATIVE
Comment: NEGATIVE
Comment: NEGATIVE
Comment: NEGATIVE
Comment: NEGATIVE
Comment: NORMAL
Neisseria Gonorrhea: NEGATIVE
Trichomonas: NEGATIVE

## 2023-11-19 NOTE — Progress Notes (Signed)
 STI swab is negative for gonorrhea, chlamydia, trichomonas, bacterial vaginitis, yeast infection.  Patient had been advised at the time that if her swab was negative it would be available on the portal and she would not receive a call.  The swab is negative.  No additional treatment needed.  Follow-up as needed.

## 2024-01-05 ENCOUNTER — Ambulatory Visit (HOSPITAL_BASED_OUTPATIENT_CLINIC_OR_DEPARTMENT_OTHER): Payer: Self-pay

## 2024-01-07 ENCOUNTER — Ambulatory Visit (HOSPITAL_BASED_OUTPATIENT_CLINIC_OR_DEPARTMENT_OTHER): Payer: Self-pay

## 2024-02-10 ENCOUNTER — Ambulatory Visit (HOSPITAL_BASED_OUTPATIENT_CLINIC_OR_DEPARTMENT_OTHER)
Admission: RE | Admit: 2024-02-10 | Discharge: 2024-02-10 | Disposition: A | Discharge status: Home / Self Care | Payer: Self-pay | Source: Ambulatory Visit | Attending: Family Medicine | Admitting: Family Medicine

## 2024-02-10 ENCOUNTER — Encounter (HOSPITAL_BASED_OUTPATIENT_CLINIC_OR_DEPARTMENT_OTHER): Payer: Self-pay

## 2024-02-10 ENCOUNTER — Other Ambulatory Visit (HOSPITAL_BASED_OUTPATIENT_CLINIC_OR_DEPARTMENT_OTHER): Payer: Self-pay

## 2024-02-10 VITALS — BP 124/81 | HR 57 | Temp 98.5°F | Resp 20

## 2024-02-10 DIAGNOSIS — R1031 Right lower quadrant pain: Secondary | ICD-10-CM | POA: Insufficient documentation

## 2024-02-10 DIAGNOSIS — N72 Inflammatory disease of cervix uteri: Secondary | ICD-10-CM | POA: Insufficient documentation

## 2024-02-10 DIAGNOSIS — R1024 Suprapubic pain: Secondary | ICD-10-CM | POA: Insufficient documentation

## 2024-02-10 DIAGNOSIS — R109 Unspecified abdominal pain: Secondary | ICD-10-CM | POA: Insufficient documentation

## 2024-02-10 DIAGNOSIS — R1032 Left lower quadrant pain: Secondary | ICD-10-CM | POA: Insufficient documentation

## 2024-02-10 LAB — POCT URINE DIPSTICK
Bilirubin, UA: NEGATIVE
Blood, UA: NEGATIVE
Glucose, UA: NEGATIVE mg/dL
Ketones, POC UA: NEGATIVE mg/dL
Leukocytes, UA: NEGATIVE
Nitrite, UA: NEGATIVE
Protein Ur, POC: NEGATIVE mg/dL
Spec Grav, UA: 1.015 (ref 1.010–1.025)
Urobilinogen, UA: 0.2 U/dL
pH, UA: 6.5 (ref 5.0–8.0)

## 2024-02-10 MED ORDER — METRONIDAZOLE 500 MG PO TABS
500.0000 mg | ORAL_TABLET | Freq: Two times a day (BID) | ORAL | 0 refills | Status: AC
  Filled 2024-02-10: qty 28, 14d supply, fill #0

## 2024-02-10 MED ORDER — IBUPROFEN 800 MG PO TABS
800.0000 mg | ORAL_TABLET | Freq: Three times a day (TID) | ORAL | 0 refills | Status: AC | PRN
  Filled 2024-02-10: qty 21, 7d supply, fill #0

## 2024-02-10 MED ORDER — CEFTRIAXONE SODIUM 500 MG IJ SOLR
500.0000 mg | INTRAMUSCULAR | Status: DC
  Administered 2024-02-10: 500 mg via INTRAMUSCULAR

## 2024-02-10 NOTE — ED Provider Notes (Addendum)
 [facility]    CSN: [id number] Arrival date & time: 02/10/24  0856      History   Chief Complaint Chief Complaint  Patient presents with   Abdominal Pain    Entered by patient    HPI Amanda Bright is a 28 y.o. female.   28 year old female who reports that her last menstrual period started on 02/03/2024.  Her.  Her cycle of bleeding stopped on 02/07/2024.  It was a normal cycle with normal flow, not excessive or heavy flow.  She chronically has significant cramps or abdominal pain during her menstrual cycles.  The cramps have persisted even though the bleeding stopped.  The cramps are moderate to severe.  She is concerned that something else is going on.   Abdominal Pain Associated symptoms: no chest pain, no chills, no constipation, no cough, no diarrhea, no dysuria, no fever, no hematuria, no nausea, no shortness of breath, no sore throat and no vomiting     Past Medical History:  Diagnosis Date   Medical history non-contributory     Patient Active Problem List   Diagnosis Date Noted   Preeclampsia 01/11/2018   Pregnancy 01/11/2018   Supervision of normal first pregnancy, antepartum 09/07/2017    Past Surgical History:  Procedure Laterality Date   APPENDECTOMY     3rd grade    OB History     Gravida  1   Para  1   Term  1   Preterm  0   AB  0   Living  1      SAB  0   IAB  0   Ectopic  0   Multiple  0   Live Births  1            Home Medications    Prior to Admission medications   Medication Sig Start Date End Date Taking? Authorizing Provider  ibuprofen  (ADVIL ) 800 MG tablet Take 1 tablet (800 mg total) by mouth every 8 (eight) hours as needed (menstrual cramps.  Take with food). 02/10/24  Yes Ival Domino, FNP  metroNIDAZOLE  (FLAGYL ) 500 MG tablet Take 1 tablet (500 mg total) by mouth 2 (two) times daily for 14 days. 02/10/24 02/24/24 Yes Ival Domino, FNP    Family History Family History  Problem Relation Age of  Onset   Alcohol abuse Neg Hx    Arthritis Neg Hx    Asthma Neg Hx    Birth defects Neg Hx    Cancer Neg Hx    COPD Neg Hx    Depression Neg Hx    Diabetes Neg Hx    Drug abuse Neg Hx    Early death Neg Hx    Hearing loss Neg Hx    Heart disease Neg Hx    Hyperlipidemia Neg Hx    Hypertension Neg Hx    Kidney disease Neg Hx    Learning disabilities Neg Hx    Mental illness Neg Hx    Mental retardation Neg Hx    Miscarriages / Stillbirths Neg Hx    Stroke Neg Hx    Vision loss Neg Hx    Varicose Veins Neg Hx     Social History Social History   Tobacco Use   Smoking status: Never   Smokeless tobacco: Never  Vaping Use   Vaping status: Never Used  Substance Use Topics   Alcohol use: Yes    Comment: social   Drug use: No     Allergies  Patient has no known allergies.   Review of Systems Review of Systems  Constitutional:  Negative for chills and fever.  HENT:  Negative for ear pain and sore throat.   Eyes:  Negative for pain and visual disturbance.  Respiratory:  Negative for cough and shortness of breath.   Cardiovascular:  Negative for chest pain and palpitations.  Gastrointestinal:  Positive for abdominal pain. Negative for constipation, diarrhea, nausea and vomiting.  Genitourinary:  Negative for dysuria and hematuria.  Musculoskeletal:  Negative for arthralgias and back pain.  Skin:  Negative for color change and rash.  Neurological:  Negative for seizures and syncope.  All other systems reviewed and are negative.    Physical Exam Triage Vital Signs ED Triage Vitals  Encounter Vitals Group     BP 02/10/24 0916 124/81     Girls Systolic BP Percentile --      Girls Diastolic BP Percentile --      Boys Systolic BP Percentile --      Boys Diastolic BP Percentile --      Pulse Rate 02/10/24 0916 (!) 57     Resp 02/10/24 0916 20     Temp 02/10/24 0916 98.5 F (36.9 C)     Temp Source 02/10/24 0916 Oral     SpO2 02/10/24 0916 98 %     Weight --       Height --      Head Circumference --      Peak Flow --      Pain Score 02/10/24 0915 5     Pain Loc --      Pain Education --      Exclude from Growth Chart --    No data found.  Updated Vital Signs BP 124/81 (BP Location: Right Arm)   Pulse (!) 57   Temp 98.5 F (36.9 C) (Oral)   Resp 20   LMP 02/03/2024 (Exact Date)   SpO2 98%   Visual Acuity Right Eye Distance:   Left Eye Distance:   Bilateral Distance:    Right Eye Near:   Left Eye Near:    Bilateral Near:     Physical Exam Vitals and nursing note reviewed. Exam conducted with a chaperone present Johnnie Plana, [employer] fundraiser).  Constitutional:      General: She is not in acute distress.    Appearance: She is well-developed. She is not ill-appearing or toxic-appearing.  HENT:     Head: Normocephalic and atraumatic.     Right Ear: Hearing, tympanic membrane, ear canal and external ear normal.     Left Ear: Hearing, tympanic membrane, ear canal and external ear normal.     Nose: No congestion or rhinorrhea.     Right Sinus: No maxillary sinus tenderness or frontal sinus tenderness.     Left Sinus: No maxillary sinus tenderness or frontal sinus tenderness.     Mouth/Throat:     Lips: Pink.     Mouth: Mucous membranes are moist.     Pharynx: Uvula midline. No oropharyngeal exudate or posterior oropharyngeal erythema.     Tonsils: No tonsillar exudate.  Eyes:     Conjunctiva/sclera: Conjunctivae normal.     Pupils: Pupils are equal, round, and reactive to light.  Cardiovascular:     Rate and Rhythm: Normal rate and regular rhythm.     Heart sounds: S1 normal and S2 normal. No murmur heard. Pulmonary:     Effort: Pulmonary effort is normal. No respiratory distress.  Breath sounds: Normal breath sounds. No decreased breath sounds, wheezing, rhonchi or rales.  Abdominal:     General: Bowel sounds are normal.     Palpations: Abdomen is soft.     Tenderness: There is abdominal tenderness in the right lower quadrant,  suprapubic area and left lower quadrant. There is no right CVA tenderness, left CVA tenderness, guarding or rebound. Negative signs include Murphy's sign, Rovsing's sign and McBurney's sign.     Hernia: There is no hernia in the left inguinal area or right inguinal area.  Genitourinary:    Exam position: Lithotomy position.     Pubic Area: No rash or pubic lice.      Labia:        Right: No rash, tenderness, lesion or injury.        Left: No rash, tenderness, lesion or injury.      Urethra: No prolapse, urethral pain, urethral swelling or urethral lesion.     Vagina: No signs of injury and foreign body. Vaginal discharge (White, mucoid discharge) present. No erythema, tenderness, bleeding, lesions or prolapsed vaginal walls.     Cervix: Cervical motion tenderness (Mild), discharge (White, mucoid discharge), friability (Minimal erythema and friability of the cervix) and erythema present. No lesion, cervical bleeding or eversion.     Uterus: Not deviated, not enlarged, not fixed, not tender and no uterine prolapse.      Adnexa:        Right: No mass, tenderness or fullness.         Left: No mass, tenderness or fullness.    Musculoskeletal:        General: No swelling.     Cervical back: Neck supple.  Lymphadenopathy:     Head:     Right side of head: No submental, submandibular, tonsillar, preauricular or posterior auricular adenopathy.     Left side of head: No submental, submandibular, tonsillar, preauricular or posterior auricular adenopathy.     Cervical: No cervical adenopathy.     Right cervical: No superficial cervical adenopathy.    Left cervical: No superficial cervical adenopathy.     Lower Body: No right inguinal adenopathy. No left inguinal adenopathy.  Skin:    General: Skin is warm and dry.     Capillary Refill: Capillary refill takes less than 2 seconds.     Findings: No rash.  Neurological:     Mental Status: She is alert and oriented to person, place, and time.   Psychiatric:        Mood and Affect: Mood normal.      UC Treatments / Results  Labs (all labs ordered are listed, but only abnormal results are displayed) Labs Reviewed  POCT URINE DIPSTICK - Normal  CERVICOVAGINAL ANCILLARY ONLY    EKG   Radiology No results found.  Procedures Procedures (including critical care time)  Medications Ordered in UC Medications  cefTRIAXone  (ROCEPHIN ) injection 500 mg (500 mg Intramuscular Given 02/10/24 1002)    Initial Impression / Assessment and Plan / UC Course  I have reviewed the triage vital signs and the nursing notes.  Pertinent labs & imaging results that were available during my care of the patient were reviewed by me and considered in my medical decision making (see chart for details).  Plan of Care: Cervicitis with abdominal pain: Urinalysis is normal.  Pelvic exam shows friable cervix with cervical motion tenderness.  STI swab collected.  Will adjust the plan of care, if needed once the swab results.  Ceftriaxone  500 mg injection now.  Metronidazole , 500 mg, 1 pill twice daily for 14 days.  Avoid any alcohol while on this antibiotic.  Take the metronidazole  after food.  Fluconazole, 150 mg, 1 pill now and repeat every 5 to 7 days, as needed for yeast infection.  Provided 4 pills and may use 4 pills over 20 days, if needed.  Get plenty of fluids and rest.  Use ibuprofen , 800 mg, 1 pill every 8 hours with food as needed for menstrual or abdominal cramps.  Work excuse provided.  Follow-up if symptoms do not improve, if symptoms worsen, or if new symptoms occur.  See discharge instructions for worsening symptoms that indicate a need to return here or go to an emergency room.  I reviewed the plan of care with the patient and/or the patient's guardian.  The patient and/or guardian had time to ask questions and acknowledged that the questions were answered.  I provided instruction on symptoms or reasons to return here or to go to an  ER, if symptoms/condition did not improve, worsened or if new symptoms occurred.  Final Clinical Impressions(s) / UC Diagnoses   Final diagnoses:  Suprapubic pain  Right lower quadrant abdominal pain  Left lower quadrant abdominal pain  Cervicitis  Abdominal cramps     Discharge Instructions      Cervicitis with abdominal pain: Urinalysis is normal.  Pelvic exam shows friable cervix with cervical motion tenderness.  STI swab collected.  Will adjust the plan of care, if needed once the swab results.    Ceftriaxone  500 mg injection now.  Metronidazole , 500 mg, 1 pill twice daily for 14 days.  Avoid any alcohol while on this antibiotic.  Take the metronidazole  after food.  Fluconazole, 150 mg, 1 pill now and repeat every 5 to 7 days, as needed for yeast infection.  Provided 4 pills and may use 4 pills over 20 days, if needed.  Get plenty of fluids and rest.  Work excuse provided.  Follow-up if symptoms do not improve, if symptoms worsen, or if new symptoms occur.  See below for worsening symptoms that indicate a need to return here or go to an emergency room.  Contact a health care provider if: Your symptoms come back or get worse after treatment. You have chills or a fever. You feel very tired. Your back or abdomen hurts. You have nausea, vomiting, or diarrhea. Get help right away if: You have severe pain in your abdomen and medicine does not help it. You cannot pee. This information is not intended to replace advice given to you by your health care provider. Make sure you discuss any questions you have with your health care provider.     ED Prescriptions     Medication Sig Dispense Auth. Provider   metroNIDAZOLE  (FLAGYL ) 500 MG tablet Take 1 tablet (500 mg total) by mouth 2 (two) times daily for 14 days. 28 tablet Devaris Quirk, FNP   ibuprofen  (ADVIL ) 800 MG tablet Take 1 tablet (800 mg total) by mouth every 8 (eight) hours as needed (menstrual cramps.  Take with food). 21  tablet Marleny Faller, FNP      PDMP not reviewed this encounter.   Ival Domino, FNP 02/10/24 1000    Ival Domino, FNP 02/10/24 1006

## 2024-02-10 NOTE — Discharge Instructions (Addendum)
 Cervicitis with abdominal pain: Urinalysis is normal.  Pelvic exam shows friable cervix with cervical motion tenderness.  STI swab collected.  Will adjust the plan of care, if needed once the swab results.    Ceftriaxone  500 mg injection now.  Metronidazole , 500 mg, 1 pill twice daily for 14 days.  Avoid any alcohol while on this antibiotic.  Take the metronidazole  after food.  Fluconazole, 150 mg, 1 pill now and repeat every 5 to 7 days, as needed for yeast infection.  Provided 4 pills and may use 4 pills over 20 days, if needed.  Get plenty of fluids and rest.  Work excuse provided.  Follow-up if symptoms do not improve, if symptoms worsen, or if new symptoms occur.  See below for worsening symptoms that indicate a need to return here or go to an emergency room.  Contact a health care provider if: Your symptoms come back or get worse after treatment. You have chills or a fever. You feel very tired. Your back or abdomen hurts. You have nausea, vomiting, or diarrhea. Get help right away if: You have severe pain in your abdomen and medicine does not help it. You cannot pee. This information is not intended to replace advice given to you by your health care provider. Make sure you discuss any questions you have with your health care provider.

## 2024-02-10 NOTE — ED Triage Notes (Signed)
 Pt c/o lower abdominal/pelvic cramping for the last 4 days. States she got off her period but it feels like she is still on her period/ labor pains. Pt denies constipation. Pt has taken Midol  with no relief.

## 2024-02-11 ENCOUNTER — Ambulatory Visit (HOSPITAL_BASED_OUTPATIENT_CLINIC_OR_DEPARTMENT_OTHER): Payer: Self-pay | Admitting: Family Medicine

## 2024-02-11 LAB — CERVICOVAGINAL ANCILLARY ONLY
Bacterial Vaginitis (gardnerella): NEGATIVE
Candida Glabrata: NEGATIVE
Candida Vaginitis: NEGATIVE
Chlamydia: NEGATIVE
Comment: NEGATIVE
Comment: NEGATIVE
Comment: NEGATIVE
Comment: NEGATIVE
Comment: NEGATIVE
Comment: NORMAL
Neisseria Gonorrhea: NEGATIVE
Trichomonas: NEGATIVE

## 2024-02-11 NOTE — Progress Notes (Signed)
 STI swab was negative.  Patient updated via voicemail message.  She was treated at the visit for cervicitis with ceftriaxone  and then metronidazole .  Advised that she could stop the metronidazole  if desired but she could complete it due to the clinical findings during the visit.  Encouraged to call back if she had any questions.
